# Patient Record
Sex: Female | Born: 1945 | Race: White | Hispanic: No | Marital: Single | Smoking: Never smoker
Health system: Southern US, Community
[De-identification: ages and names within clinical notes are randomized; demographics above are authoritative.]

## PROBLEM LIST (undated history)

## (undated) DIAGNOSIS — R251 Tremor, unspecified: Secondary | ICD-10-CM

## (undated) DIAGNOSIS — D649 Anemia, unspecified: Secondary | ICD-10-CM

## (undated) HISTORY — PX: APPENDECTOMY: SHX54

## (undated) HISTORY — PX: TONSILLECTOMY AND ADENOIDECTOMY: SHX28

## (undated) HISTORY — DX: Tremor, unspecified: R25.1

## (undated) HISTORY — DX: Anemia, unspecified: D64.9

---

## 2000-07-04 ENCOUNTER — Other Ambulatory Visit: Admission: RE | Admit: 2000-07-04 | Discharge: 2000-07-04 | Payer: Self-pay | Admitting: Family Medicine

## 2000-07-10 ENCOUNTER — Encounter: Payer: Self-pay | Admitting: Family Medicine

## 2000-07-10 ENCOUNTER — Encounter: Admission: RE | Admit: 2000-07-10 | Discharge: 2000-07-10 | Payer: Self-pay | Admitting: Family Medicine

## 2001-03-13 ENCOUNTER — Encounter: Admission: RE | Admit: 2001-03-13 | Discharge: 2001-03-13 | Payer: Self-pay | Admitting: Family Medicine

## 2001-03-13 ENCOUNTER — Encounter: Payer: Self-pay | Admitting: Family Medicine

## 2001-09-29 ENCOUNTER — Other Ambulatory Visit: Admission: RE | Admit: 2001-09-29 | Discharge: 2001-09-29 | Payer: Self-pay | Admitting: *Deleted

## 2002-11-02 ENCOUNTER — Other Ambulatory Visit: Admission: RE | Admit: 2002-11-02 | Discharge: 2002-11-02 | Payer: Self-pay | Admitting: *Deleted

## 2003-11-08 ENCOUNTER — Other Ambulatory Visit: Admission: RE | Admit: 2003-11-08 | Discharge: 2003-11-08 | Payer: Self-pay | Admitting: *Deleted

## 2004-11-16 ENCOUNTER — Other Ambulatory Visit: Admission: RE | Admit: 2004-11-16 | Discharge: 2004-11-16 | Payer: Self-pay | Admitting: *Deleted

## 2005-11-19 ENCOUNTER — Other Ambulatory Visit: Admission: RE | Admit: 2005-11-19 | Discharge: 2005-11-19 | Payer: Self-pay | Admitting: Obstetrics and Gynecology

## 2006-11-21 ENCOUNTER — Other Ambulatory Visit: Admission: RE | Admit: 2006-11-21 | Discharge: 2006-11-21 | Payer: Self-pay | Admitting: Obstetrics & Gynecology

## 2007-11-24 ENCOUNTER — Other Ambulatory Visit: Admission: RE | Admit: 2007-11-24 | Discharge: 2007-11-24 | Payer: Self-pay | Admitting: Obstetrics and Gynecology

## 2008-01-06 ENCOUNTER — Emergency Department (HOSPITAL_COMMUNITY): Admission: EM | Admit: 2008-01-06 | Discharge: 2008-01-06 | Payer: Self-pay | Admitting: Emergency Medicine

## 2008-11-24 ENCOUNTER — Other Ambulatory Visit: Admission: RE | Admit: 2008-11-24 | Discharge: 2008-11-24 | Payer: Self-pay | Admitting: Obstetrics and Gynecology

## 2009-09-02 IMAGING — CT CT CERVICAL SPINE W/O CM
4 of 5 series · 15 of 33 positions shown, 17 images · IV contrast (agent unspecified)
Comparison: None.

CLINICAL DATA: Syncopal episode. Struck left side of head and neck.
 HEAD CT WITHOUT CONTRAST:
TECHNIQUE: Contiguous axial images were obtained from the base of the skull through the vertex according to standard protocol without contrast.
TECHNIQUE: Multidetector CT imaging of the cervical spine was performed.  Multiplanar CT image reconstructions were also generated.

[Series 5: c_spine 2.0 b31s · axial · 0.23mm/px · z∈[-270,-188]mm · 3 of 83 slices shown]
[im 21/83  bone]
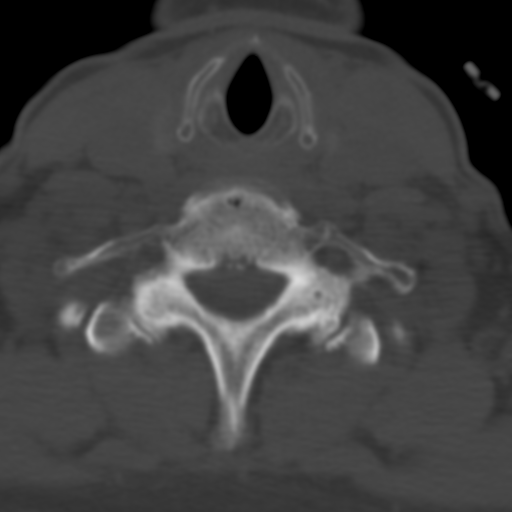
[im 42/83  bone]
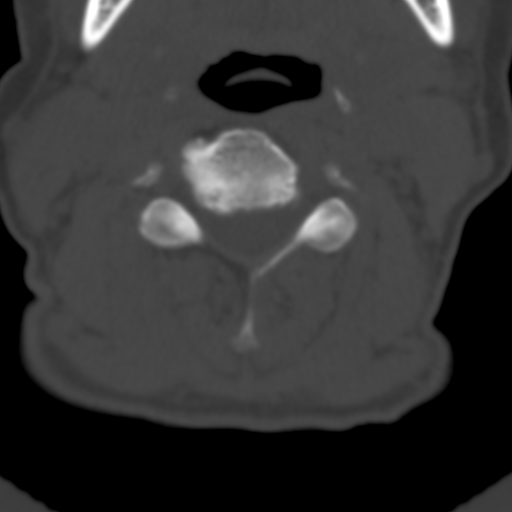
[im 62/83  bone]
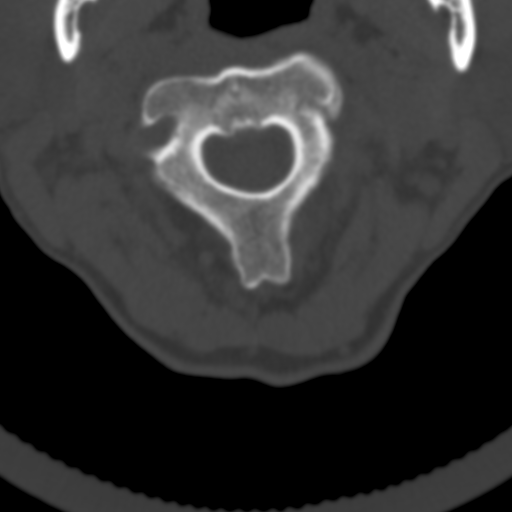

[Series 602: sagittal images · sagittal · 0.32mm/px · 5 of 56 slices shown, 6 images]
[im 19/56  bone]
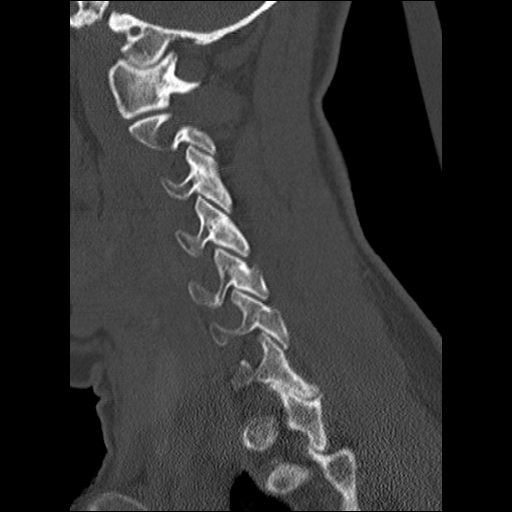
[im 23/56  bone]
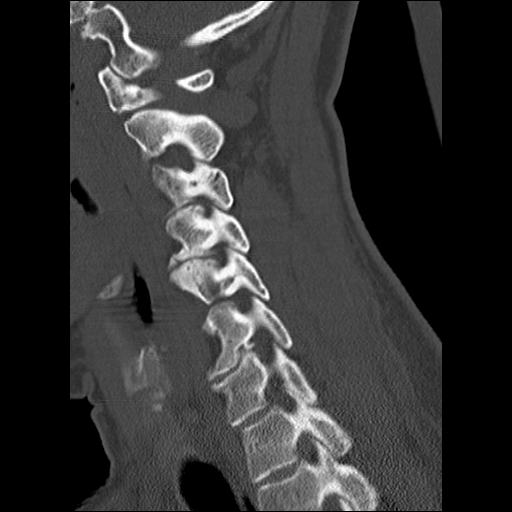
[im 28/56  soft-tissue]
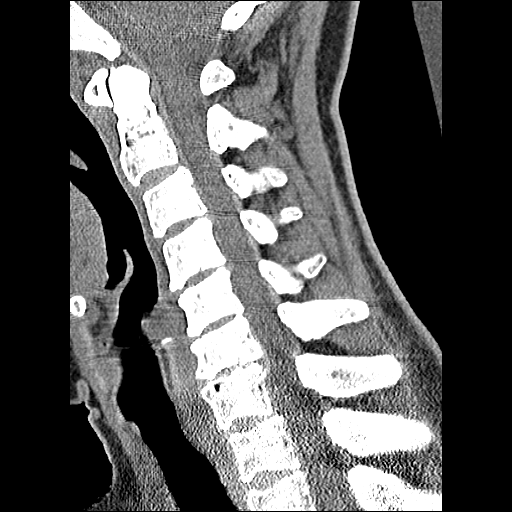
[im 28/56  bone]
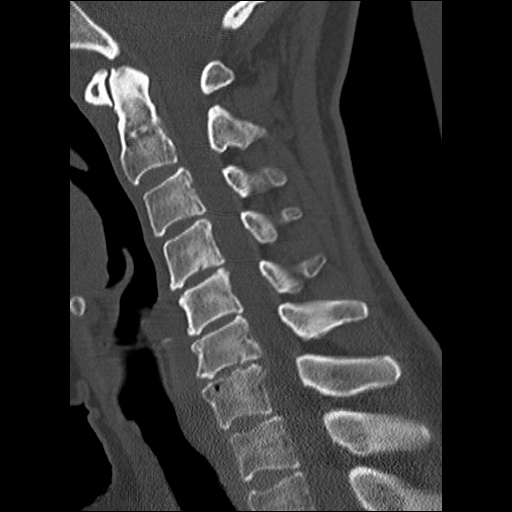
[im 33/56  bone]
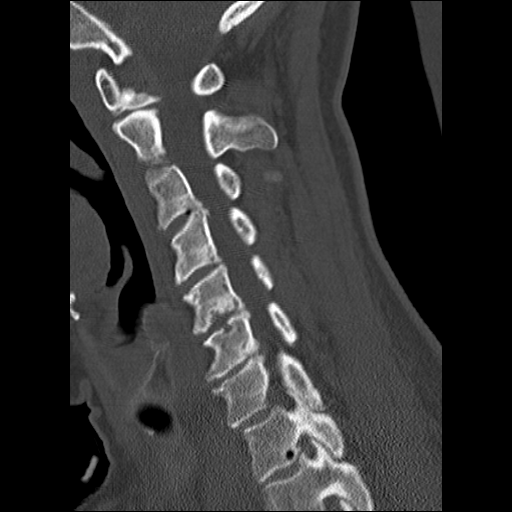
[im 37/56  bone]
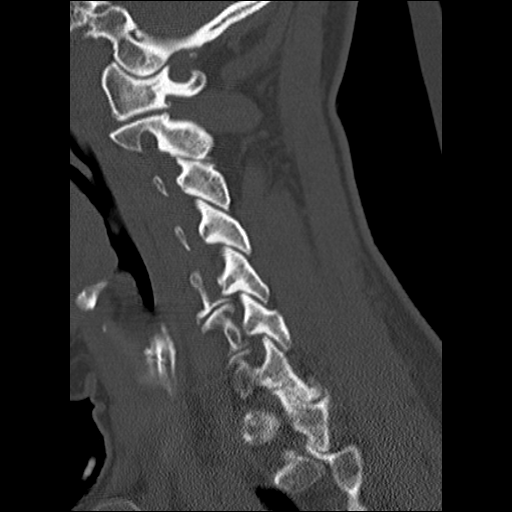

[Series 603: axial reformats · axial · 0.32mm/px · z∈[-317,-215]mm · 4 of 93 slices shown, 5 images]
[im 19/93  soft-tissue]
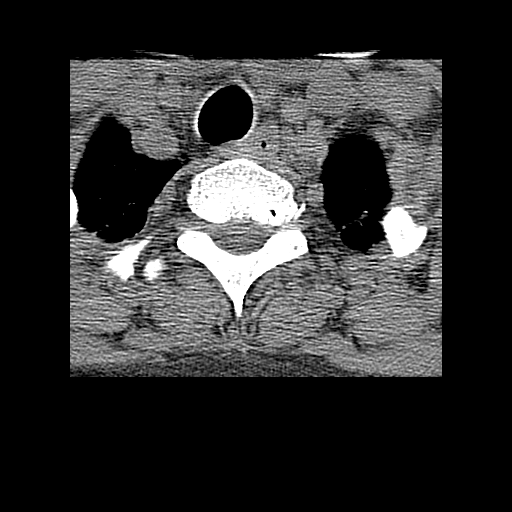
[im 19/93  bone]
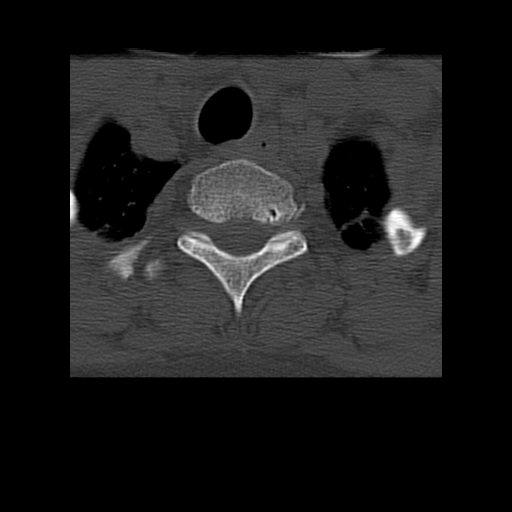
[im 37/93  bone]
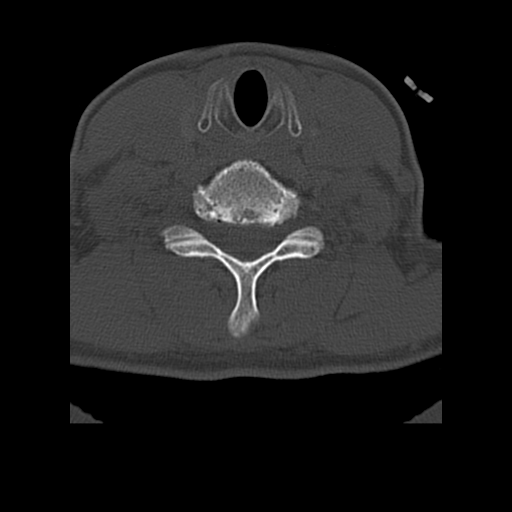
[im 56/93  bone]
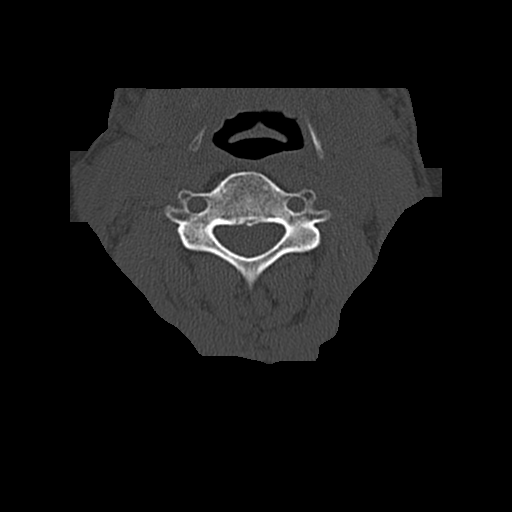
[im 74/93  bone]
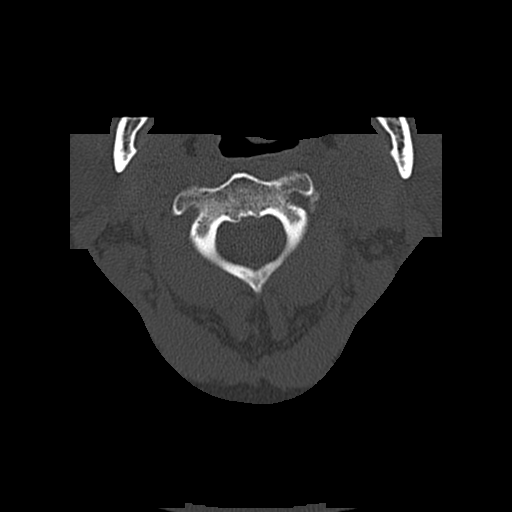

[Series 604: coronal images · coronal · 0.32mm/px · 3 of 47 slices shown]
[im 10/47  bone]
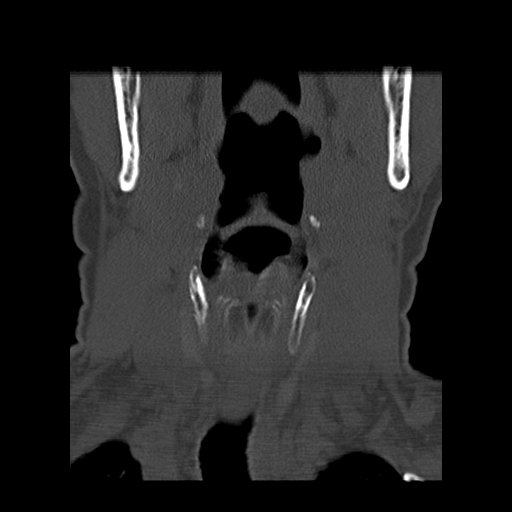
[im 19/47  bone]
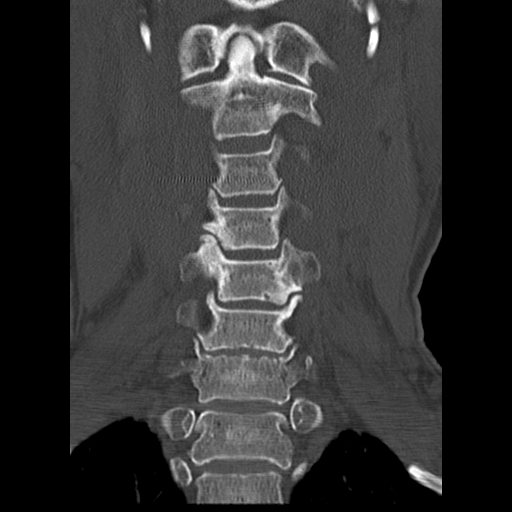
[im 28/47  bone]
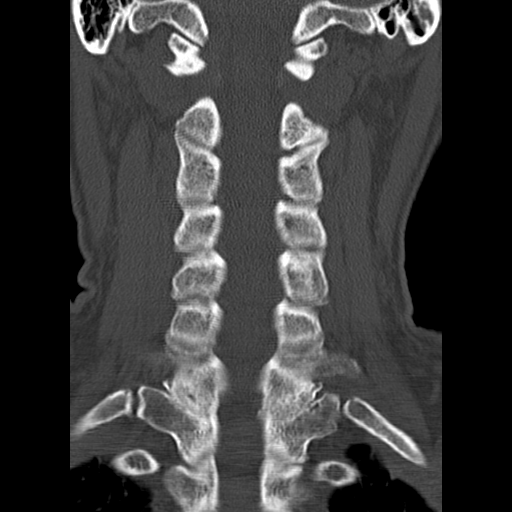

[15 of 33 positions shown; findings below may reference images not displayed]

FINDINGS: No acute or focal intracranial abnormality.  Calvarium intact. There is no fluid in the sinuses.  There are some changes of chronic sinusitis with a nodular or polypoid density along the medial wall of the left maxillary sinus.
IMPRESSION: 1.  No acute intracranial abnormality.
 2.  Mild changes of chronic sinusitis.
 CERVICAL SPINE CT WITHOUT CONTRAST:
FINDINGS: There are degenerative changes especially at C6-7.  There are no fractures or subluxations. At C5-6, there is relative narrowing of the AP dimension of the neural canal, and there is also mild biforaminal stenosis. There is also relative narrowing of the AP diameter of the neural canal at C6-7.  There are no obvious fractures.  This is probably mild congenital and acquired spinal stenosis.  This could make the patient predisposed to cord injury in the setting of trauma.
 Prevertebral soft tissues are normal.
IMPRESSION: No acute fractures or subluxations.  There is relative spinal stenosis at C5-6 and C6-7 which could make the patient predisposed to spinal cord injury. This needs careful clinical correlation.

## 2011-06-15 ENCOUNTER — Other Ambulatory Visit: Payer: Self-pay | Admitting: Obstetrics and Gynecology

## 2011-06-15 DIAGNOSIS — Z1231 Encounter for screening mammogram for malignant neoplasm of breast: Secondary | ICD-10-CM

## 2011-07-02 ENCOUNTER — Ambulatory Visit (HOSPITAL_COMMUNITY)
Admission: RE | Admit: 2011-07-02 | Discharge: 2011-07-02 | Disposition: A | Discharge status: Home / Self Care | Payer: BC Managed Care – PPO | Source: Ambulatory Visit | Attending: Obstetrics and Gynecology | Admitting: Obstetrics and Gynecology

## 2011-07-02 DIAGNOSIS — Z1231 Encounter for screening mammogram for malignant neoplasm of breast: Secondary | ICD-10-CM

## 2011-08-03 LAB — BASIC METABOLIC PANEL
GFR calc non Af Amer: >60
Potassium: 3.9
Sodium: 138

## 2011-08-03 LAB — DIFFERENTIAL
Basophils Absolute: 0
Basophils Relative: 0
Eosinophils Absolute: 0.1
Lymphocytes Relative: 13
Lymphs Abs: 0.9
Monocytes Relative: 8

## 2011-08-03 LAB — CBC
HCT: 36.5
Hemoglobin: 12.5
RBC: 4.04
WBC: 7.2

## 2012-01-08 ENCOUNTER — Other Ambulatory Visit: Payer: Self-pay | Admitting: Plastic Surgery

## 2013-08-12 ENCOUNTER — Telehealth: Payer: Self-pay | Admitting: Nurse Practitioner

## 2013-08-12 NOTE — Telephone Encounter (Signed)
Last mammogram done 07/02/12 at [facility].  Left message to inform.

## 2013-08-12 NOTE — Telephone Encounter (Signed)
Pt would like to find out where she got her last mammogram done.

## 2013-09-03 ENCOUNTER — Telehealth: Payer: Self-pay | Admitting: Nurse Practitioner

## 2013-09-03 DIAGNOSIS — Z1321 Encounter for screening for nutritional disorder: Secondary | ICD-10-CM

## 2013-09-03 NOTE — Telephone Encounter (Signed)
Call to patient, poor connection and line disconnects.

## 2013-09-03 NOTE — Telephone Encounter (Signed)
Chief Complaint  Patient presents with   Medication Refill  pt would like a prescription for vitamin d. Would like to know where she can fill that wont be so expensive.

## 2013-09-04 MED ORDER — VITAMIN D3 1.25 MG (50000 UT) PO CAPS: 1.0000 | ORAL_CAPSULE | ORAL | Status: DC

## 2013-09-04 NOTE — Telephone Encounter (Signed)
Patient calling back. She would like a paper rx for this rx if you decide to fill for the year and pick up today.

## 2013-09-04 NOTE — Telephone Encounter (Signed)
Kayla Wang,  Patient calling for refill vitamin D 50,000 units 1 tablet po q week.  Last AEX 12/2012.  Last Vitamin D Level in chart I could find was done 12/30/11 and was 26.  Does she need labs first before refill?

## 2013-09-04 NOTE — Telephone Encounter (Signed)
Can she get Vit D drawn today when she comes in to ger [facility]?

## 2013-09-04 NOTE — Telephone Encounter (Signed)
Message left to return call to Stuart at [phone]. Rx will be at front desk. Lab ordered. Rx to [facility] for signature.

## 2013-12-24 ENCOUNTER — Ambulatory Visit (INDEPENDENT_AMBULATORY_CARE_PROVIDER_SITE_OTHER): Payer: Medicare Other | Admitting: Nurse Practitioner

## 2013-12-24 ENCOUNTER — Encounter: Payer: Self-pay | Admitting: *Deleted

## 2013-12-24 VITALS — BP 120/66 | HR 72 | Ht 65.5 in | Wt 160.0 lb

## 2013-12-24 DIAGNOSIS — Z01419 Encounter for gynecological examination (general) (routine) without abnormal findings: Secondary | ICD-10-CM

## 2013-12-24 DIAGNOSIS — Z0000 Encounter for general adult medical examination without abnormal findings: Secondary | ICD-10-CM

## 2013-12-24 DIAGNOSIS — E559 Vitamin D deficiency, unspecified: Secondary | ICD-10-CM

## 2013-12-24 LAB — POCT URINALYSIS DIPSTICK
BILIRUBIN UA: NEGATIVE
Glucose, UA: NEGATIVE
KETONES UA: NEGATIVE
LEUKOCYTES UA: NEGATIVE
Nitrite, UA: NEGATIVE
PH UA: 5
Protein, UA: NEGATIVE
RBC UA: NEGATIVE
Urobilinogen, UA: NEGATIVE

## 2013-12-24 LAB — HEMOGLOBIN, FINGERSTICK: Hemoglobin, fingerstick: 13 g/dL (ref 12.0–16.0)

## 2013-12-24 NOTE — Progress Notes (Signed)
Patient ID: Kayla Wang, female   DOB: 1946-09-17, 68 y.o.   MRN: [id number] 68 y.o. G0P0 Single Caucasian Fe here for annual exam. No new health problems.  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  No LMP recorded. Patient is postmenopausal.          Sexually active: no  The current method of family planning is none.    Exercising: yes  swimming and walking 4-5 times per week Smoker:  no  Health Maintenance: Pap:  12/20/11, WNL, neg HR HPV MMG:  08/13/13, need add'l images; 08/18/13, Bi-Rads 1: negative Colonoscopy:  02/19/05, repeat in 10 years BMD:  11/2009, 0.8/0.0/1.1 TDaP: 01/15/07 at PCP Labs:  HB:  13.0 Urine:  negative   reports that she has never smoked. She has never used smokeless tobacco. She reports that she does not drink alcohol or use illicit drugs.  Past Medical History  Diagnosis Date  . Anemia     Past Surgical History  Procedure Laterality Date  . Tonsillectomy and adenoidectomy  age 25  . Appendectomy  age 80    Current Outpatient Prescriptions  Medication Sig Dispense Refill  . Cholecalciferol (VITAMIN D3) 50000 UNITS CAPS Take 1 capsule by mouth once a week.  12 capsule  3   No current facility-administered medications for this visit.    Family History  Problem Relation Age of Onset  . Osteoporosis Mother   . Celiac disease Sister   . Cancer Maternal Grandfather     unknown, heavy smoker    ROS:  Pertinent items are noted in HPI.  Otherwise, a comprehensive ROS was negative.  Exam:   BP 120/66  Pulse 72  Ht 5' 5.5" (1.664 m)  Wt 160 lb (72.576 kg)  BMI 26.21 kg/m2 Height: 5' 5.5" (166.4 cm)  Ht Readings from Last 3 Encounters:  12/24/13 5' 5.5" (1.664 m)    General appearance: alert, cooperative and appears stated age Head: Normocephalic, without obvious abnormality, atraumatic Neck: no adenopathy, supple, symmetrical, trachea midline and thyroid normal to inspection and palpation Lungs: clear to  auscultation bilaterally Breasts: normal appearance, no masses or tenderness Heart: regular rate and rhythm Abdomen: soft, non-tender; no masses,  no organomegaly Extremities: extremities normal, atraumatic, no cyanosis or edema Skin: Skin color, texture, turgor normal. No rashes or lesions Lymph nodes: Cervical, supraclavicular, and axillary nodes normal. No abnormal inguinal nodes palpated Neurologic: Grossly normal   Pelvic: External genitalia:  no lesions              Urethra:  normal appearing urethra with no masses, tenderness or lesions              Bartholin's and Skene's: normal                 Vagina: normal appearing vagina with normal color and discharge, no lesions              Cervix: anteverted              Pap taken: no Bimanual Exam:  Uterus:  normal size, contour, position, consistency, mobility, non-tender              Adnexa: no mass, fullness, tenderness               Rectovaginal: Confirms               Anus:  normal sphincter tone, no lesions  A:  Well Woman with normal exam  Postmenopausal - no HRT  FMH heart disease  Vit D deficiency  P:   Pap smear as per guidelines   Mammogram due 10/15  Refill Vit D  Will return for fasting  Counseled on breast self exam, mammography screening, adequate intake of calcium and vitamin D, diet and exercise return annually or prn  An After Visit Summary was printed and given to the patient.

## 2013-12-24 NOTE — Patient Instructions (Signed)

## 2013-12-25 ENCOUNTER — Telehealth: Payer: Self-pay | Admitting: Nurse Practitioner

## 2013-12-25 LAB — VITAMIN D 25 HYDROXY (VIT D DEFICIENCY, FRACTURES): Vit D, 25-Hydroxy: 26 ng/mL — ABNORMAL LOW (ref 30–89)

## 2013-12-25 NOTE — Telephone Encounter (Signed)
Had AEX with Kayla Wang on 12-24-13.  Patient canceled lab appt, just FYI. If no follow-up, close encounter.

## 2013-12-25 NOTE — Telephone Encounter (Signed)
Patient called and canceled her lab appt for next week said she just wants to have her primary doctor do all of that. Said not to run anything through.

## 2013-12-28 ENCOUNTER — Other Ambulatory Visit (INDEPENDENT_AMBULATORY_CARE_PROVIDER_SITE_OTHER): Payer: Medicare Other

## 2013-12-28 DIAGNOSIS — Z1321 Encounter for screening for nutritional disorder: Secondary | ICD-10-CM

## 2013-12-28 DIAGNOSIS — Z0000 Encounter for general adult medical examination without abnormal findings: Secondary | ICD-10-CM

## 2013-12-28 LAB — COMPREHENSIVE METABOLIC PANEL
ALBUMIN: 4.3 g/dL (ref 3.5–5.2)
ALK PHOS: 46 U/L (ref 39–117)
ALT: 10 U/L (ref 0–35)
AST: 13 U/L (ref 0–37)
BUN: 15 mg/dL (ref 6–23)
CALCIUM: 9 mg/dL (ref 8.4–10.5)
CO2: 27 mEq/L (ref 19–32)
Chloride: 106 mEq/L (ref 96–112)
Creat: 0.79 mg/dL (ref 0.50–1.10)
GLUCOSE: 96 mg/dL (ref 70–99)
POTASSIUM: 4.4 meq/L (ref 3.5–5.3)
Sodium: 140 mEq/L (ref 135–145)
Total Bilirubin: 0.4 mg/dL (ref 0.2–1.2)
Total Protein: 6.3 g/dL (ref 6.0–8.3)

## 2013-12-28 LAB — LIPID PANEL
CHOLESTEROL: 169 mg/dL (ref 0–200)
HDL: 57 mg/dL (ref >39)
LDL Cholesterol: 92 mg/dL (ref 0–99)
TRIGLYCERIDES: 99 mg/dL (ref <150)
Total CHOL/HDL Ratio: 3 Ratio
VLDL: 20 mg/dL (ref 0–40)

## 2013-12-28 LAB — TSH: TSH: 2.721 u[IU]/mL (ref 0.350–4.500)

## 2013-12-29 NOTE — Progress Notes (Signed)
Encounter reviewed by Dr. Conley SimmondsBrook Silva. Order was placed for fasting labs.

## 2013-12-30 ENCOUNTER — Other Ambulatory Visit: Payer: Medicare Other

## 2014-12-27 ENCOUNTER — Ambulatory Visit: Payer: Medicare Other | Admitting: Nurse Practitioner

## 2015-03-03 ENCOUNTER — Encounter: Payer: Self-pay | Admitting: Nurse Practitioner

## 2015-03-03 ENCOUNTER — Ambulatory Visit (INDEPENDENT_AMBULATORY_CARE_PROVIDER_SITE_OTHER): Payer: Medicare Other | Admitting: Nurse Practitioner

## 2015-03-03 VITALS — BP 90/56 | HR 60 | Ht 65.5 in | Wt 156.0 lb

## 2015-03-03 DIAGNOSIS — Z0000 Encounter for general adult medical examination without abnormal findings: Secondary | ICD-10-CM

## 2015-03-03 DIAGNOSIS — E559 Vitamin D deficiency, unspecified: Secondary | ICD-10-CM | POA: Diagnosis not present

## 2015-03-03 DIAGNOSIS — R319 Hematuria, unspecified: Secondary | ICD-10-CM | POA: Diagnosis not present

## 2015-03-03 DIAGNOSIS — Z01419 Encounter for gynecological examination (general) (routine) without abnormal findings: Secondary | ICD-10-CM | POA: Diagnosis not present

## 2015-03-03 LAB — POCT URINALYSIS DIPSTICK
BILIRUBIN UA: NEGATIVE
Glucose, UA: NEGATIVE
Ketones, UA: NEGATIVE
Leukocytes, UA: NEGATIVE
NITRITE UA: NEGATIVE
PH UA: 5.5
Urobilinogen, UA: NEGATIVE

## 2015-03-03 MED ORDER — VITAMIN D3 1.25 MG (50000 UT) PO CAPS: 1.0000 | ORAL_CAPSULE | ORAL | Status: DC

## 2015-03-03 NOTE — Progress Notes (Signed)
Patient ID: Kayla Wang, female   DOB: 06-07-1946, 69 y.o.   MRN: [id number] 69 y.o. G0P0 Single  Caucasian Fe here for annual exam.  No new health problems.  Patient's last menstrual period was 07/13/2000.          Sexually active: No.  The current method of family planning is none and abstinence.    Exercising: Yes.    swimming and walking Smoker:  no  Health Maintenance: Pap:  12/20/11, negative with neg HR HPV MMG:  08/19/14, Bi-Rads 1:  Negative Colonoscopy:  02/19/05, repeat in 10 years will schedule with Dr. Loreta AveMann BMD:   11/2009, 0.8/0.0/1.1 TDaP:  01/15/07 Labs:  HB:  13.4  Urine:  Trace RBC, trace protein   reports that she has never smoked. She has never used smokeless tobacco. She reports that she does not drink alcohol or use illicit drugs.  Past Medical History  Diagnosis Date  . Anemia     Past Surgical History  Procedure Laterality Date  . Tonsillectomy and adenoidectomy  age 69  . Appendectomy  age 69    Current Outpatient Prescriptions  Medication Sig Dispense Refill  . Cholecalciferol (VITAMIN D3) 50000 UNITS CAPS Take 1 capsule by mouth once a week. 30 capsule 3   No current facility-administered medications for this visit.    Family History  Problem Relation Age of Onset  . Osteoporosis Mother   . Celiac disease Sister   . Cancer Maternal Grandfather     unknown, heavy smoker    ROS:  Pertinent items are noted in HPI.  Otherwise, a comprehensive ROS was negative.  Exam:   BP 90/56 mmHg  Pulse 60  Ht 5' 5.5" (1.664 m)  Wt 156 lb (70.761 kg)  BMI 25.56 kg/m2  LMP 07/13/2000 Height: 5' 5.5" (166.4 cm) Ht Readings from Last 3 Encounters:  03/03/15 5' 5.5" (1.664 m)  12/24/13 5' 5.5" (1.664 m)    General appearance: alert, cooperative and appears stated age Head: Normocephalic, without obvious abnormality, atraumatic Neck: no adenopathy, supple, symmetrical, trachea midline and thyroid normal to inspection and palpation Lungs: clear to  auscultation bilaterally Breasts: normal appearance, no masses or tenderness Heart: regular rate and rhythm Abdomen: soft, non-tender; no masses,  no organomegaly Extremities: extremities normal, atraumatic, no cyanosis or edema Skin: Skin color, texture, turgor normal. No rashes or lesions Lymph nodes: Cervical, supraclavicular, and axillary nodes normal. No abnormal inguinal nodes palpated Neurologic: Grossly normal   Pelvic: External genitalia:  no lesions              Urethra:  normal appearing urethra with no masses, tenderness or lesions              Bartholin's and Skene's: normal                 Vagina: normal appearing vagina with normal color and discharge, no lesions              Cervix: anteverted              Pap taken: Yes.   Bimanual Exam:  Uterus:  normal size, contour, position, consistency, mobility, non-tender              Adnexa: no mass, fullness, tenderness               Rectovaginal: Confirms               Anus:  normal sphincter tone, no lesions  Chaperone present:  yes  A:  Well Woman with normal exam  Postmenopausal - no HRT FMH heart disease Vit D deficiency   P:   Reviewed health and wellness pertinent to exam  Pap smear taken today  Mammogram is due 08/2015  Note faxed for BMD  Refill on Vit D and will follow with labs  Counseled on breast self exam, mammography screening, adequate intake of calcium and vitamin D, diet and exercise return annually or prn  An After Visit Summary was printed and given to the patient.

## 2015-03-03 NOTE — Patient Instructions (Signed)

## 2015-03-04 LAB — URINALYSIS, MICROSCOPIC ONLY
CASTS: NONE SEEN
CRYSTALS: NONE SEEN
Squamous Epithelial / LPF: NONE SEEN

## 2015-03-04 LAB — IPS PAP SMEAR ONLY

## 2015-03-04 LAB — VITAMIN D 25 HYDROXY (VIT D DEFICIENCY, FRACTURES): VIT D 25 HYDROXY: 44 ng/mL (ref 30–100)

## 2015-03-06 NOTE — Progress Notes (Signed)
Encounter reviewed by Dr. Brook Silva.  

## 2015-03-07 LAB — HEMOGLOBIN, FINGERSTICK: HEMOGLOBIN, FINGERSTICK: 13.4 g/dL (ref 12.0–16.0)

## 2015-03-10 ENCOUNTER — Telehealth: Payer: Self-pay | Admitting: Nurse Practitioner

## 2015-03-10 NOTE — Telephone Encounter (Signed)
Spoke with patient. Advised of message as seen below from Lauro FranklinPatricia Rolen-Grubb, FNP. Patient is agreeable and verbalizes understanding.  Notes Recorded by Luisa DagoStephanie C Phillips, CMA on 03/08/2015 at 2:12 PM Pt notified of results via [phone] (Home) *Preferred* per DPR. Pt advised in message to call with any questions. Notes Recorded by Roanna BanningPatricia R Grubb, FNP on 03/04/2015 at 6:07 PM pap02 Notes Recorded by Roanna BanningPatricia R Grubb, FNP on 03/04/2015 at 8:19 AM Let pt. Know that Vit D is 44 - last year at 3826. Much better. Continue on weekly dose and will recheck at next AEX. Urine with no RBC.  Routing to provider for final review. Patient agreeable to disposition. Will close encounter

## 2015-03-10 NOTE — Telephone Encounter (Signed)
Patient states someone called her yesterday with results and she is returning a call. I do not see a message.

## 2015-03-17 ENCOUNTER — Telehealth: Payer: Self-pay | Admitting: Nurse Practitioner

## 2015-03-17 NOTE — Telephone Encounter (Signed)
Please let pt. Know that BMD done on 03/07/15 shows a T Score at the spine of 0.5; left hip neck -0.2; right hip neck 0.0.  These all fall in the normal BMD range.    The FRAX score for major fracture in 10 years is 7.1% (goal is <20%);  FRAX score for major hip fracture in 10 years is 0.3% (goal is <3% ).   Comparison from the study 12/10/11 shows there has been a bone loss at the spine of 2.5 %.  While some bone loss is normal and expectant, we do not want to see further loss at the spine.  Must do exercises with upper body weight to maintain the stability.  Also continue with calcium and Vit D.

## 2015-03-17 NOTE — Telephone Encounter (Signed)
I have attempted to contact this patient by phone with the following results: left message to return my call on answering machine (home per DPR).  [phone])

## 2015-03-21 NOTE — Telephone Encounter (Signed)
Pt notified of results.  She is agreeable and voices understanding.  She will call with any further questions.

## 2015-03-28 ENCOUNTER — Ambulatory Visit: Payer: Self-pay | Admitting: Nurse Practitioner

## 2015-07-11 ENCOUNTER — Encounter: Payer: Self-pay | Admitting: Nurse Practitioner

## 2015-07-11 ENCOUNTER — Ambulatory Visit (INDEPENDENT_AMBULATORY_CARE_PROVIDER_SITE_OTHER): Payer: Medicare Other | Admitting: Nurse Practitioner

## 2015-07-11 VITALS — BP 100/60 | HR 60 | Resp 14 | Wt 146.8 lb

## 2015-07-11 DIAGNOSIS — R319 Hematuria, unspecified: Secondary | ICD-10-CM

## 2015-07-11 DIAGNOSIS — N390 Urinary tract infection, site not specified: Secondary | ICD-10-CM

## 2015-07-11 LAB — POCT URINALYSIS DIPSTICK
Bilirubin, UA: NEGATIVE
Blood, UA: 50
GLUCOSE UA: NEGATIVE
KETONES UA: NEGATIVE
Nitrite, UA: NEGATIVE
PROTEIN UA: NEGATIVE
Urobilinogen, UA: NEGATIVE
pH, UA: 5

## 2015-07-11 MED ORDER — NITROFURANTOIN MONOHYD MACRO 100 MG PO CAPS: 100.0000 mg | ORAL_CAPSULE | Freq: Two times a day (BID) | ORAL | Status: DC

## 2015-07-11 NOTE — Patient Instructions (Signed)
Urinary Tract Infection Urinary tract infections (UTIs) can develop anywhere along your urinary tract. Your urinary tract is your body's drainage system for removing wastes and extra water. Your urinary tract includes two kidneys, two ureters, a bladder, and a urethra. Your kidneys are a pair of bean-shaped organs. Each kidney is about the size of your fist. They are located below your ribs, one on each side of your spine. CAUSES Infections are caused by microbes, which are microscopic organisms, including fungi, viruses, and bacteria. These organisms are so small that they can only be seen through a microscope. Bacteria are the microbes that most commonly cause UTIs. SYMPTOMS  Symptoms of UTIs may vary by age and gender of the patient and by the location of the infection. Symptoms in young women typically include a frequent and intense urge to urinate and a painful, burning feeling in the bladder or urethra during urination. Older women and men are more likely to be tired, shaky, and weak and have muscle aches and abdominal pain. A fever may mean the infection is in your kidneys. Other symptoms of a kidney infection include pain in your back or sides below the ribs, nausea, and vomiting. DIAGNOSIS To diagnose a UTI, your caregiver will ask you about your symptoms. Your caregiver also will ask to provide a urine sample. The urine sample will be tested for bacteria and white blood cells. White blood cells are made by your body to help fight infection. TREATMENT  Typically, UTIs can be treated with medication. Because most UTIs are caused by a bacterial infection, they usually can be treated with the use of antibiotics. The choice of antibiotic and length of treatment depend on your symptoms and the type of bacteria causing your infection. HOME CARE INSTRUCTIONS  If you were prescribed antibiotics, take them exactly as your caregiver instructs you. Finish the medication even if you feel better after you  have only taken some of the medication.  Drink enough water and fluids to keep your urine clear or pale yellow.  Avoid caffeine, tea, and carbonated beverages. They tend to irritate your bladder.  Empty your bladder often. Avoid holding urine for long periods of time.  Empty your bladder before and after sexual intercourse.  After a bowel movement, women should cleanse from front to back. Use each tissue only once. SEEK MEDICAL CARE IF:   You have back pain.  You develop a fever.  Your symptoms do not begin to resolve within 3 days. SEEK IMMEDIATE MEDICAL CARE IF:   You have severe back pain or lower abdominal pain.  You develop chills.  You have nausea or vomiting.  You have continued burning or discomfort with urination. MAKE SURE YOU:   Understand these instructions.  Will watch your condition.  Will get help right away if you are not doing well or get worse. Document Released: 08/08/2005 Document Revised: 04/29/2012 Document Reviewed: 12/07/2011 [facility] Patient Information 2015 [place], [place]. This information is not intended to replace advice given to you by your health care provider. Make sure you discuss any questions you have with your health care provider.   AZO for urinary symptoms

## 2015-07-11 NOTE — Progress Notes (Signed)
S:  69 y.o.SW female G0 presents with complaint of UTI. Symptoms began on Saturday evening. With symptoms of dysuria, urinary frequency, urinary urgency. Pertinent negatives include The patient is having no constitutional symptoms, denying fever, chills, anorexia, or weight loss.  Sexually active no  Symptoms not related to post coital. Menopausal and no complaints of vaginal dryness. Last UTI documented over 20 years ago  ROS: no weight loss, fever, night sweats and feels well  O alert, oriented to person, place, and time, normal mood, behavior, speech, dress, motor activity, and thought processes   thin, healthy,  alert and  not in acute distress  Abdomen:  Mild tenderness over the suprapubic  No CVA tenderness  deferred   Diagnostic Test:    Urinalysis:  Large WBC, trace of RBC   PROCEDURES:  Urine for C&S and micro   Assessment: R/O UTI  Plan:  Maintain adequate hydration. Follow up if symptoms not improving, and as needed.   Medication Therapy: Macrobid 100 mg BID printed   Lab: will follow with Urine Culture     RV

## 2015-07-12 LAB — URINALYSIS, MICROSCOPIC ONLY
Bacteria, UA: NONE SEEN [HPF]
CASTS: NONE SEEN [LPF]
Crystals: NONE SEEN [HPF]
Yeast: NONE SEEN [HPF]

## 2015-07-13 ENCOUNTER — Telehealth: Payer: Self-pay | Admitting: Nurse Practitioner

## 2015-07-13 NOTE — Progress Notes (Signed)
Encounter reviewed by Dr. Brook Amundson C. Silva.  

## 2015-07-13 NOTE — Telephone Encounter (Signed)
Pt was left a message that urine culture not back yet and stay on med's until we get final results.

## 2015-07-14 LAB — URINE CULTURE: Colony Count: 75000

## 2015-07-15 NOTE — Telephone Encounter (Signed)
-----   Message from Ria Comment, FNP sent at 07/15/2015  9:41 AM EDT ----- Please let pt. Know that her urine culture was positive and she is on the correct antibiotic.  Complete all of med's and increase fluids.  When med's are completed if any symptoms continue to let me know.

## 2015-07-15 NOTE — Telephone Encounter (Signed)
Return call to Stephanie. °

## 2015-07-15 NOTE — Telephone Encounter (Signed)
I have attempted to contact this patient by phone with the following results: left message to return call to [place] at [phone] answering machine (home per [facility]).  No personal information given.  [phone] (Home) *Preferred*

## 2015-07-19 NOTE — Telephone Encounter (Signed)
I have attempted to contact this patient by phone with the following results: left message to return call to [place] at [phone] answering machine (home per [facility]). No personal information given. [phone] (Home) *Preferred* Advised pt to ask for triage nurse if I am unavailable.

## 2015-07-21 NOTE — Telephone Encounter (Signed)
Pt notified in result note.  Closing encounter. 

## 2015-09-19 ENCOUNTER — Telehealth: Payer: Self-pay | Admitting: Nurse Practitioner

## 2015-09-19 NOTE — Telephone Encounter (Signed)
Patient called wants to know how often she should get a bone density done. Best # to reach patient: [phone]

## 2015-09-19 NOTE — Telephone Encounter (Signed)
Left message to call Kayla Wang at [phone]. 

## 2015-09-19 NOTE — Telephone Encounter (Signed)
Please refer to BMD report as noted.  She falls in normal range.  She could have a repeat in 2-3 years.   Please let pt. Know that BMD done on 03/07/15 shows a T Score at the spine of 0.5; left hip neck -0.2; right hip neck 0.0. These all fall in the normal BMD range. The FRAX score for major fracture in 10 years is 7.1% (goal is <20%); FRAX score for major hip fracture in 10 years is 0.3% (goal is <3% ). Comparison from the study 12/10/11 shows there has been a bone loss at the spine of 2.5 %. While some bone loss is normal and expectant, we do not want to see further loss at the spine. Must do exercises with upper body weight to maintain the stability. Also continue with calcium and Vit D.

## 2015-09-22 MED ORDER — VITAMIN D3 1.25 MG (50000 UT) PO CAPS: 1.0000 | ORAL_CAPSULE | ORAL | Status: DC

## 2015-09-22 NOTE — Telephone Encounter (Signed)
Signed rx for Vitamin D 50,000 IU weekly mailed to patient's verified home address. Patient notified and is agreeable.  Routing to provider for final review. Patient agreeable to disposition. Will close encounter.

## 2015-09-22 NOTE — Telephone Encounter (Signed)
signed

## 2015-09-22 NOTE — Telephone Encounter (Signed)
Spoke with patient. Advised of message and results as seen below from Ria CommentPatricia Grubb, FNP. Patient is agreeable and verbalizes understanding. Patient states that she is on her last refill of her Vitamin D 50,000 IU which she is taking weekly. Requesting a paper rx be mailed to her home address on file so that she can check the price of the prescription with different pharmacies. Vitamin D was last drawn on 03/03/2015 and was 44. Was Advised to continue taking Vitamin D 50,000 IU weekly for one year (please see result note).   Routing to PepsiCoDeborah Leonard CNM as Ria CommentPatricia Grubb, FNP is out of the office today. Rx to Leota Sauerseborah Leonard CNM for signature.

## 2016-03-05 ENCOUNTER — Telehealth: Payer: Self-pay | Admitting: Nurse Practitioner

## 2016-03-05 ENCOUNTER — Encounter: Payer: Self-pay | Admitting: Nurse Practitioner

## 2016-03-05 ENCOUNTER — Ambulatory Visit (INDEPENDENT_AMBULATORY_CARE_PROVIDER_SITE_OTHER): Payer: Medicare Other | Admitting: Nurse Practitioner

## 2016-03-05 VITALS — BP 110/60 | HR 74 | Resp 16 | Ht 65.25 in | Wt 146.0 lb

## 2016-03-05 DIAGNOSIS — E559 Vitamin D deficiency, unspecified: Secondary | ICD-10-CM | POA: Diagnosis not present

## 2016-03-05 DIAGNOSIS — Z0000 Encounter for general adult medical examination without abnormal findings: Secondary | ICD-10-CM | POA: Diagnosis not present

## 2016-03-05 DIAGNOSIS — Z01419 Encounter for gynecological examination (general) (routine) without abnormal findings: Secondary | ICD-10-CM

## 2016-03-05 DIAGNOSIS — Z205 Contact with and (suspected) exposure to viral hepatitis: Secondary | ICD-10-CM

## 2016-03-05 LAB — POCT URINALYSIS DIPSTICK
BILIRUBIN UA: NEGATIVE
GLUCOSE UA: NEGATIVE
Ketones, UA: NEGATIVE
LEUKOCYTES UA: NEGATIVE
NITRITE UA: NEGATIVE
Protein, UA: NEGATIVE
Urobilinogen, UA: NEGATIVE
pH, UA: 5

## 2016-03-05 MED ORDER — VITAMIN D3 1.25 MG (50000 UT) PO CAPS: 1.0000 | ORAL_CAPSULE | ORAL | Status: DC

## 2016-03-05 NOTE — Progress Notes (Signed)
70 y.o. G0P0 Single  Caucasian Fe here for annual exam.  No new diagnosis.  Feels that her life is going well and enjoys her time.  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  She is now working in the area of [employer].   Patient's last menstrual period was 07/13/2000.          Sexually active: No.  The current method of family planning is post menopausal status.    Exercising: Yes.    Swim, walking  3-5 times a week Smoker:  no  Health Maintenance: Pap:  03/03/15 Neg MMG:  10/12/15 BIRADS1:neg Colonoscopy:  03/21/15 Normal - repeat 10 years  BMD:   03/07/15 Normal  TDaP:  01/15/2007  Shingles: Unsure Pneumonia: Unsure  Hep C : done today Labs: Fasting labs here today   reports that she has never smoked. She has never used smokeless tobacco. She reports that she does not drink alcohol or use illicit drugs.  Past Medical History  Diagnosis Date  . Anemia     Past Surgical History  Procedure Laterality Date  . Tonsillectomy and adenoidectomy  age 70  . Appendectomy  age 70    Current Outpatient Prescriptions  Medication Sig Dispense Refill  . Cholecalciferol (VITAMIN D3) 50000 units CAPS Take 1 capsule by mouth once a week. 30 capsule 3   No current facility-administered medications for this visit.    Family History  Problem Relation Age of Onset  . Osteoporosis Mother   . Celiac disease Sister   . Cancer Maternal Grandfather     unknown, heavy smoker    ROS:  Pertinent items are noted in HPI.  Otherwise, a comprehensive ROS was negative.  Exam:   BP 110/60 mmHg  Pulse 74  Resp 16  Ht 5' 5.25" (1.657 m)  Wt 146 lb (66.225 kg)  BMI 24.12 kg/m2  LMP 07/13/2000 Height: 5' 5.25" (165.7 cm) Ht Readings from Last 3 Encounters:  03/05/16 5' 5.25" (1.657 m)  03/03/15 5' 5.5" (1.664 m)  12/24/13 5' 5.5" (1.664 m)    General appearance: alert, cooperative and appears stated  age Head: Normocephalic, without obvious abnormality, atraumatic Neck: no adenopathy, supple, symmetrical, trachea midline and thyroid normal to inspection and palpation Lungs: clear to auscultation bilaterally Breasts: normal appearance, no masses or tenderness Heart: regular rate and rhythm Abdomen: soft, non-tender; no masses,  no organomegaly Extremities: extremities normal, atraumatic, no cyanosis or edema Skin: Skin color, texture, turgor normal. No rashes or lesions Lymph nodes: Cervical, supraclavicular, and axillary nodes normal. No abnormal inguinal nodes palpated Neurologic: Grossly normal   Pelvic: External genitalia:  no lesions              Urethra:  normal appearing urethra with no masses, tenderness or lesions              Bartholin's and Skene's: normal                 Vagina: normal appearing vagina with normal color and discharge, no lesions              Cervix: anteverted              Pap taken: No. Bimanual Exam:  Uterus:  normal size, contour, position, consistency, mobility, non-tender              Adnexa: no mass, fullness, tenderness  Rectovaginal: Confirms               Anus:  normal sphincter tone, no lesions  Chaperone present: no  A:  Well Woman with normal exam  Postmenopausal - no HRT FMH heart disease Vit D deficiency   P:   Reviewed health and wellness pertinent to exam  Pap smear as above  Mammogram is due 11/12017  Will follow with labs  Refill on Vit D and will follow with labs  Counseled on breast self exam, mammography screening, adequate intake of calcium and vitamin D, diet and exercise, Kegel's exercises return annually or prn  An After Visit Summary was printed and given to the patient.

## 2016-03-05 NOTE — Telephone Encounter (Signed)
Patient coming today at [id number]. Wants fasting labs.  Routing to Ria CommentPatricia Grubb, FNP

## 2016-03-05 NOTE — Telephone Encounter (Signed)
Patient requesting to have fasting labs done today at her appointment. She's scheduled for 1:45 and will need orders.

## 2016-03-05 NOTE — Patient Instructions (Addendum)

## 2016-03-06 LAB — COMPREHENSIVE METABOLIC PANEL
ALT: 12 U/L (ref 6–29)
AST: 15 U/L (ref 10–35)
Albumin: 4.3 g/dL (ref 3.6–5.1)
Alkaline Phosphatase: 42 U/L (ref 33–130)
BILIRUBIN TOTAL: 0.4 mg/dL (ref 0.2–1.2)
BUN: 17 mg/dL (ref 7–25)
CO2: 27 mmol/L (ref 20–31)
CREATININE: 0.78 mg/dL (ref 0.50–0.99)
Calcium: 9.2 mg/dL (ref 8.6–10.4)
Chloride: 105 mmol/L (ref 98–110)
GLUCOSE: 103 mg/dL — AB (ref 65–99)
Potassium: 4.3 mmol/L (ref 3.5–5.3)
SODIUM: 141 mmol/L (ref 135–146)
Total Protein: 6.6 g/dL (ref 6.1–8.1)

## 2016-03-06 LAB — TSH: TSH: 1.39 m[IU]/L

## 2016-03-06 LAB — LIPID PANEL
CHOLESTEROL: 167 mg/dL (ref 125–200)
HDL: 70 mg/dL (ref >=46)
LDL Cholesterol: 85 mg/dL (ref <130)
Total CHOL/HDL Ratio: 2.4 Ratio (ref <=5.0)
Triglycerides: 60 mg/dL (ref <150)
VLDL: 12 mg/dL (ref <30)

## 2016-03-06 LAB — VITAMIN D 25 HYDROXY (VIT D DEFICIENCY, FRACTURES): VIT D 25 HYDROXY: 39 ng/mL (ref 30–100)

## 2016-03-06 LAB — HEPATITIS C ANTIBODY: HCV Ab: NEGATIVE

## 2016-03-09 NOTE — Progress Notes (Signed)
Encounter reviewed by Dr. Brook Amundson C. Silva.  

## 2016-04-18 ENCOUNTER — Telehealth: Payer: Self-pay | Admitting: Nurse Practitioner

## 2016-04-18 MED ORDER — VITAMIN D3 1.25 MG (50000 UT) PO CAPS: 1.0000 | ORAL_CAPSULE | ORAL | Status: DC

## 2016-04-18 NOTE — Telephone Encounter (Signed)
Patient has lost her prescription for Vit D.  She would like to pick it up if possible. She also has some questions regarding a list of things she was suppose to do.

## 2016-04-18 NOTE — Telephone Encounter (Signed)
Spoke with patient. Patient states that Kayla CommentPatricia Grubb, FNP wrote her an rx for her Vitamin D, but she has misplaced the prescription. Asking if a new rx can be written for her to pick up. Rx for Vitamin D 50,000 IU take 1 tablet every 7 days #30 3RF printed for Kayla CommentPatricia Grubb, FNP review and signature. Advised this prescription will be placed at the front desk for pick up. She is agreeable. Patient also has questions about her AVS. All questions answered. She is agreeable.

## 2016-10-09 ENCOUNTER — Telehealth: Payer: Self-pay | Admitting: Nurse Practitioner

## 2016-10-09 NOTE — Telephone Encounter (Signed)
Patient called requesting an order for an "annual 3-D screening mammogram" be sent to [facility]. She said, "My insurance company requires this to cover the annual 3-D mammogram or they will consider it diagnostic and not cover it. I am not having any problems."  Patient requests a call back to confirm this has been done prior to her appointment on 10/12/16

## 2016-10-09 NOTE — Telephone Encounter (Signed)
Spoke with patient. Advised order sent to [facility] for 3D Screening MMG as requested. Patient is thankful and verbalizes understanding.  Routing to provider for final review. Patient is agreeable to disposition. Will close encounter.

## 2016-10-24 ENCOUNTER — Encounter: Payer: Self-pay | Admitting: Nurse Practitioner

## 2017-03-04 ENCOUNTER — Telehealth: Payer: Self-pay | Admitting: Nurse Practitioner

## 2017-03-04 DIAGNOSIS — Z0000 Encounter for general adult medical examination without abnormal findings: Secondary | ICD-10-CM

## 2017-03-04 NOTE — Telephone Encounter (Signed)
Patient is requesting fasting labs. She is scheduled on 03/06/17 for her annual appointment. Will she need an order.

## 2017-03-04 NOTE — Telephone Encounter (Signed)
Agree with these orders.

## 2017-03-04 NOTE — Telephone Encounter (Signed)
Future lab orders for CBC, CMP, TSH, Vitamin D, and lipid panel placed.  Routing to Ria Comment, FNP for review and for any additional labs to be added if needed.

## 2017-03-05 NOTE — Progress Notes (Signed)
71 y.o. G0P0 Single  Caucasian Fe here for annual exam.  Hit right knee and saw PCP.  Had XRAY done showing OA of knees.  She feels well otherwise.  Patient's last menstrual period was 07/13/2000.          Sexually active: No.  The current method of family planning is post menopausal status.    Exercising: Yes.    swimming and walking Smoker:  no  Health Maintenance: Pap: 03/03/15, Negative  12/20/11, Negative with neg HR HPV History of Abnormal Pap: no MMG: 10/12/16, 3D, Bi-Rads 1:  Negative Self Breast exams: yes Colonoscopy: 03/21/15, Normal, repeat in 10 years BMD: 03/07/15 T Score: -0.5 Spine / 0.0 Right Femur Neck / -0.2 Left Femur Neck, scheduled for 03/07/17 TDaP: 01/15/07 Shingles: 11/28/11 Pneumonia: 10/10/16, 02/23/14 (both Prevnar-13) HIV: 03/05/16 Labs: HB: 12.7 and fasting labs today   reports that she has never smoked. She has never used smokeless tobacco. She reports that she does not drink alcohol or use drugs.  Past Medical History:  Diagnosis Date  . Anemia     Past Surgical History:  Procedure Laterality Date  . APPENDECTOMY  age 45  . TONSILLECTOMY AND ADENOIDECTOMY  age 60    Current Outpatient Prescriptions  Medication Sig Dispense Refill  . Cholecalciferol (VITAMIN D3) 50000 units CAPS Take 1 capsule by mouth once a week. 30 capsule 3   No current facility-administered medications for this visit.     Family History  Problem Relation Age of Onset  . Osteoporosis Mother   . Celiac disease Sister   . Cancer Maternal Grandfather     unknown, heavy smoker    ROS:  Pertinent items are noted in HPI.  Otherwise, a comprehensive ROS was negative.  Exam:   BP 116/64 (BP Location: Right Arm, Patient Position: Sitting, Cuff Size: Normal)   Pulse 60   Ht 5' 5.25" (1.657 m)   Wt 164 lb (74.4 kg)   LMP 07/13/2000   BMI 27.08 kg/m  Height: 5' 5.25" (165.7 cm) Ht Readings from Last 3 Encounters:  03/06/17 5' 5.25" (1.657 m)  03/05/16 5' 5.25" (1.657 m)   03/03/15 5' 5.5" (1.664 m)    General appearance: alert, cooperative and appears stated age Head: Normocephalic, without obvious abnormality, atraumatic Neck: no adenopathy, supple, symmetrical, trachea midline and thyroid normal to inspection and palpation Lungs: clear to auscultation bilaterally Breasts: normal appearance, no masses or tenderness Heart: regular rate and rhythm Abdomen: soft, non-tender; no masses,  no organomegaly Extremities: extremities normal, atraumatic, no cyanosis or edema Skin: Skin color, texture, turgor normal. No rashes or lesions Lymph nodes: Cervical, supraclavicular, and axillary nodes normal. No abnormal inguinal nodes palpated Neurologic: Grossly normal   Pelvic: External genitalia:  no lesions              Urethra:  normal appearing urethra with no masses, tenderness or lesions              Bartholin's and Skene's: normal                 Vagina: normal appearing vagina with normal color and discharge, no lesions              Cervix: anteverted              Pap taken: Yes.   Bimanual Exam:  Uterus:  normal size, contour, position, consistency, mobility, non-tender              Adnexa: no  mass, fullness, tenderness               Rectovaginal: Confirms               Anus:  normal sphincter tone, no lesions  Chaperone present: yes  A:  Well Woman with normal exam Postmenopausal - no HRT FMH heart disease Vit D deficiency  Update TDaP  P:   Reviewed health and wellness pertinent to exam  Pap smear: yes  Mammogram is due 10/2017  Counseled on breast self exam, mammography screening, adequate intake of calcium and vitamin D, diet and exercise, Kegel's exercises return annually or prn  An After Visit Summary was printed and given to the patient.

## 2017-03-06 ENCOUNTER — Ambulatory Visit (INDEPENDENT_AMBULATORY_CARE_PROVIDER_SITE_OTHER): Payer: Medicare Other | Admitting: Nurse Practitioner

## 2017-03-06 ENCOUNTER — Encounter: Payer: Self-pay | Admitting: Nurse Practitioner

## 2017-03-06 VITALS — BP 116/64 | HR 60 | Ht 65.25 in | Wt 164.0 lb

## 2017-03-06 DIAGNOSIS — Z01411 Encounter for gynecological examination (general) (routine) with abnormal findings: Secondary | ICD-10-CM

## 2017-03-06 DIAGNOSIS — Z0000 Encounter for general adult medical examination without abnormal findings: Secondary | ICD-10-CM

## 2017-03-06 DIAGNOSIS — E559 Vitamin D deficiency, unspecified: Secondary | ICD-10-CM

## 2017-03-06 DIAGNOSIS — Z23 Encounter for immunization: Secondary | ICD-10-CM | POA: Diagnosis not present

## 2017-03-06 MED ORDER — VITAMIN D3 1.25 MG (50000 UT) PO CAPS: 1.0000 | ORAL_CAPSULE | ORAL | 3 refills | Status: DC

## 2017-03-06 NOTE — Patient Instructions (Addendum)

## 2017-03-07 ENCOUNTER — Other Ambulatory Visit: Payer: Self-pay | Admitting: Nurse Practitioner

## 2017-03-07 LAB — CBC
HEMATOCRIT: 40.5 % (ref 35.0–45.0)
HEMOGLOBIN: 13.2 g/dL (ref 11.7–15.5)
MCH: 30 pg (ref 27.0–33.0)
MCHC: 32.6 g/dL (ref 32.0–36.0)
MCV: 92 fL (ref 80.0–100.0)
MPV: 9.4 fL (ref 7.5–12.5)
Platelets: 201 10*3/uL (ref 140–400)
RBC: 4.4 MIL/uL (ref 3.80–5.10)
RDW: 13.5 % (ref 11.0–15.0)
WBC: 3.6 10*3/uL — AB (ref 3.8–10.8)

## 2017-03-07 LAB — COMPREHENSIVE METABOLIC PANEL
ALBUMIN: 4.1 g/dL (ref 3.6–5.1)
ALK PHOS: 44 U/L (ref 33–130)
ALT: 12 U/L (ref 6–29)
AST: 14 U/L (ref 10–35)
BILIRUBIN TOTAL: 0.3 mg/dL (ref 0.2–1.2)
BUN: 11 mg/dL (ref 7–25)
CALCIUM: 8.9 mg/dL (ref 8.6–10.4)
CO2: 26 mmol/L (ref 20–31)
Chloride: 106 mmol/L (ref 98–110)
Creat: 0.92 mg/dL (ref 0.50–0.99)
Glucose, Bld: 103 mg/dL — ABNORMAL HIGH (ref 65–99)
Potassium: 3.9 mmol/L (ref 3.5–5.3)
Sodium: 141 mmol/L (ref 135–146)
Total Protein: 6.1 g/dL (ref 6.1–8.1)

## 2017-03-07 LAB — LIPID PANEL
CHOLESTEROL: 169 mg/dL (ref <200)
HDL: 57 mg/dL (ref >50)
LDL Cholesterol: 93 mg/dL (ref <100)
TRIGLYCERIDES: 95 mg/dL (ref <150)
Total CHOL/HDL Ratio: 3 Ratio (ref <5.0)
VLDL: 19 mg/dL (ref <30)

## 2017-03-07 LAB — TSH: TSH: 1.58 m[IU]/L

## 2017-03-07 NOTE — Progress Notes (Signed)
Encounter reviewed by Dr. Rosealynn Mateus Amundson C. Silva.  

## 2017-03-08 LAB — VITAMIN D 25 HYDROXY (VIT D DEFICIENCY, FRACTURES): Vit D, 25-Hydroxy: 41 ng/mL (ref 30–100)

## 2017-03-08 LAB — IPS PAP SMEAR ONLY

## 2017-03-09 LAB — HEMOGLOBIN A1C
Hgb A1c MFr Bld: 5.1 % (ref <5.7)
Mean Plasma Glucose: 100 mg/dL

## 2017-03-11 ENCOUNTER — Telehealth: Payer: Self-pay

## 2017-03-11 NOTE — Telephone Encounter (Signed)
-----   Message from Ria Comment, FNP sent at 03/11/2017  8:08 AM EDT ----- Please let pt know that Vit D was normal at 41 - (continue same dose), TSH, lipid, CBC, and CMP were all normal except a slight elevated blood sugar.  We then did HGB AIC which is very normal at 5.1.  (this is reported on separate lab).  Pap is normal 02 recall.

## 2017-03-11 NOTE — Telephone Encounter (Signed)
Left message to call Kaitlyn at [phone]. 

## 2017-03-12 NOTE — Telephone Encounter (Signed)
Spoke with patient. Advised of results as seen below from Ria Comment, FNP. Patient is agreeable an verbalizes understanding. 02 recall placed. Patient requests lab work be sent to her home address on file. Verbal request for release of medical records completed and to [facility].  Routing to provider for final review. Patient agreeable to disposition. Will close encounter.

## 2017-03-22 ENCOUNTER — Telehealth: Payer: Self-pay | Admitting: Nurse Practitioner

## 2017-03-22 NOTE — Telephone Encounter (Signed)
Please let pt know that BMD done on 03/07/17 shows that she is normal at spine and both hips.  Compared to previous study of 03/06/05 she did have a little loss of -3% at the spine and -6% change at the right hip.  She still needs to maintain exercise, calcium and Vit D.  Repeat again in 3-4 yrs.

## 2017-03-29 NOTE — Telephone Encounter (Signed)
Patient notified of bone density results.  She is appreciative of results and requests copy. Verbal Request for Release of Medical Records is completed and given to Greta to mail records. Address verified with patient.  Hard copy of report sent to scan.  Closing encounter.

## 2017-03-29 NOTE — Telephone Encounter (Signed)
I have attempted to contact this patient by phone with the following results: left message to return call to DemarestStephanie at [phone] on answering machine ([facility] Advised call was regarding recent bone density exam. [phone] (Home) *Preferred*

## 2017-04-10 ENCOUNTER — Encounter: Payer: Self-pay | Admitting: Nurse Practitioner

## 2017-09-10 ENCOUNTER — Telehealth: Payer: Self-pay | Admitting: Obstetrics and Gynecology

## 2017-09-10 NOTE — Telephone Encounter (Signed)
Order signed and sent back to you.

## 2017-09-10 NOTE — Telephone Encounter (Signed)
Signed order faxed to [facility]. Patient has been notified. Encounter closed.

## 2017-09-10 NOTE — Telephone Encounter (Signed)
Patient states that our office always sends a request to [facility] for her to have a 3D mammogram. Patient stated that the 3D mammogram would only be covered by her insurance if our office sends a request.

## 2017-09-10 NOTE — Telephone Encounter (Signed)
Order for 3D bilateral screening mammogram to Dr.Silva for review before faxing.

## 2017-09-20 ENCOUNTER — Telehealth: Payer: Self-pay | Admitting: Obstetrics and Gynecology

## 2017-09-20 NOTE — Telephone Encounter (Signed)
Patient called requesting a written prescription for vitamin D be mailed to her home address on file. She is researching local pharmacies for the lowest cost and wants to take the prescription in herself once she decides on one.

## 2017-09-20 NOTE — Telephone Encounter (Signed)
I have just reviewed the patient's vitamin D levels from 6 and 12 months ago, and they were normal.  Vitamin D supplementation recommendations have changed recently.  High dose vitamin D supplementation is not recommended at her most recent levels.  I recommend she take over the counter vitamin D 800 IU daily.  She can have this rechecked at her next annual exam.

## 2017-09-20 NOTE — Telephone Encounter (Signed)
Left message to call Aseneth Hack at [phone].  

## 2017-09-20 NOTE — Telephone Encounter (Signed)
Returned call to patient, left message. Advised request received, will review with provider and return call with recommendations. May return call to [phone] with any additional questions.  Last Vitamin D:41 on 03/07/17   [facility]ifiy remaining refills, closed, opens at 10am.

## 2017-09-20 NOTE — Telephone Encounter (Signed)
Spoke with patient. Patient states she misplaced RX provided on 03/06/17, never took it to pharmacy, only has a few Vitamin D left. Reports she is taking 50,000 IU weekly. Requesting written RX.   Advised patient would review with Dr. Edward JollySilva and return call with recommendations. Rx would need to be picked up from office. Patient thankful and agreeable.  Last Vit D 41 on 03/07/17.  Dr. Edward JollySilva -please advise on RX for Vitamin D 50,000 IU?

## 2017-09-23 NOTE — Telephone Encounter (Signed)
Attempted home and mobile numbers on file. Left message to call Noreene LarssonJill at [phone].

## 2017-09-23 NOTE — Telephone Encounter (Signed)
Spoke with patient, advised as seen below per Dr. Edward JollySilva. Patient thankful for f/u, verbalizes understanding and is agreeable.  Routing to provider for final review. Patient is agreeable to disposition. Will close encounter.

## 2017-10-28 ENCOUNTER — Encounter: Payer: Self-pay | Admitting: Obstetrics and Gynecology

## 2018-03-10 ENCOUNTER — Ambulatory Visit: Payer: Medicare Other | Admitting: Nurse Practitioner

## 2018-03-10 ENCOUNTER — Ambulatory Visit (INDEPENDENT_AMBULATORY_CARE_PROVIDER_SITE_OTHER): Payer: Medicare Other | Admitting: Obstetrics and Gynecology

## 2018-03-10 ENCOUNTER — Encounter: Payer: Self-pay | Admitting: Obstetrics and Gynecology

## 2018-03-10 ENCOUNTER — Other Ambulatory Visit: Payer: Self-pay

## 2018-03-10 VITALS — BP 110/58 | HR 80 | Resp 16 | Ht 65.25 in | Wt 166.0 lb

## 2018-03-10 DIAGNOSIS — Z01419 Encounter for gynecological examination (general) (routine) without abnormal findings: Secondary | ICD-10-CM

## 2018-03-10 NOTE — Patient Instructions (Signed)

## 2018-03-10 NOTE — Progress Notes (Signed)
72 y.o. G0P0000 Single Caucasian female here for annual exam.    Stopped prescription vitamin D.   Retired Programmer, systems.  Committed to safety in school.  She has leg and feet cramps.   PCP: Pamelia Hoit, MD    Patient's last menstrual period was 07/13/2000.           Sexually active: No.  The current method of family planning is post menopausal status.    Exercising: Yes.    swimming and walking Smoker:  no  Health Maintenance: Pap:  03/06/17 Pap smear negative History of abnormal Pap:  no MMG:  10/15/17 BIRADS 1 negative/density b Colonoscopy:  03/21/15, Normal, repeat in 10 years BMD:   03/07/17  Result  Normal TDaP:  03/06/17 Gardasil:   n/a HIV:  Donated blood in the 1980s.  Hep C: 03/05/16 Negative Screening Labs:   PCP.   reports that she has never smoked. She has never used smokeless tobacco. She reports that she does not drink alcohol or use drugs.  Past Medical History:  Diagnosis Date  . Anemia     Past Surgical History:  Procedure Laterality Date  . APPENDECTOMY  age 70  . TONSILLECTOMY AND ADENOIDECTOMY  age 3    Current Outpatient Medications  Medication Sig Dispense Refill  . Calcium Citrate-Vitamin D (CITRACAL MAXIMUM PO) Take 1 tablet by mouth daily.    . Multiple Vitamins-Calcium (ONE-A-DAY WOMENS FORMULA PO) Take 1 tablet by mouth daily.     No current facility-administered medications for this visit.     Family History  Problem Relation Age of Onset  . Osteoporosis Mother   . Heart attack Father   . Celiac disease Sister   . Cancer Maternal Grandfather        unknown, heavy smoker  . Heart disease Paternal Grandfather     Review of Systems  Constitutional: Negative.   HENT: Negative.   Eyes: Negative.   Respiratory: Negative.   Cardiovascular: Negative.   Gastrointestinal: Negative.   Endocrine: Negative.   Genitourinary: Negative.   Musculoskeletal:       Cramp in calf and foot  Skin: Negative.   Allergic/Immunologic: Negative.    Neurological: Negative.   Hematological: Negative.   Psychiatric/Behavioral: Negative.     Exam:   BP (!) 110/58 (BP Location: Right Arm, Patient Position: Sitting, Cuff Size: Normal)   Pulse 80   Resp 16   Ht 5' 5.25" (1.657 m)   Wt 166 lb (75.3 kg)   LMP 07/13/2000   BMI 27.41 kg/m     General appearance: alert, cooperative and appears stated age Head: Normocephalic, without obvious abnormality, atraumatic Neck: no adenopathy, supple, symmetrical, trachea midline and thyroid normal to inspection and palpation Lungs: clear to auscultation bilaterally Breasts: normal appearance, no masses or tenderness, No nipple retraction or dimpling, No nipple discharge or bleeding, No axillary or supraclavicular adenopathy Heart: regular rate and rhythm Abdomen: soft, non-tender; no masses, no organomegaly Extremities: extremities normal, atraumatic, no cyanosis or edema Skin: Skin color, texture, turgor normal. No rashes or lesions Lymph nodes: Cervical, supraclavicular, and axillary nodes normal. No abnormal inguinal nodes palpated Neurologic: Grossly normal  Pelvic: External genitalia:  no lesions              Urethra:  normal appearing urethra with no masses, tenderness or lesions              Bartholins and Skenes: normal  Vagina: normal appearing vagina with normal color and discharge, no lesions.  Atrophy noted.               Cervix: no lesions              Pap taken: No. Bimanual Exam:  Uterus:  normal size, contour, position, consistency, mobility, non-tender              Adnexa: no mass, fullness, tenderness              Rectal exam: Yes.  .  Confirms.              Anus:  normal sphincter tone, no lesions  Chaperone was present for exam.  Assessment:   Well woman visit with normal exam. Leg cramping.   Hx low vit D.   Plan: Mammogram screening yearly.  Recommended self breast awareness. Pap next year.  Guidelines for Calcium, Vitamin D, regular exercise  program including cardiovascular and weight bearing exercise. Check vit D.  Routine labs with PCP.  We reviewed nocturnal leg cramps in Up To Date and supplements such as a B complex.   She is already doing this with her vitamin routine, and so she will see her PCP about her leg cramping.  Follow up annually and prn.   After visit summary provided.

## 2018-03-11 LAB — VITAMIN D 25 HYDROXY (VIT D DEFICIENCY, FRACTURES): VIT D 25 HYDROXY: 49.7 ng/mL (ref 30.0–100.0)

## 2018-03-13 ENCOUNTER — Other Ambulatory Visit: Payer: Medicare Other

## 2018-03-13 ENCOUNTER — Other Ambulatory Visit: Payer: Self-pay

## 2018-03-13 DIAGNOSIS — Z0000 Encounter for general adult medical examination without abnormal findings: Secondary | ICD-10-CM

## 2018-03-14 LAB — LIPID PANEL
CHOL/HDL RATIO: 2.9 ratio (ref 0.0–4.4)
CHOLESTEROL TOTAL: 148 mg/dL (ref 100–199)
HDL: 51 mg/dL (ref >39)
LDL CALC: 80 mg/dL (ref 0–99)
TRIGLYCERIDES: 84 mg/dL (ref 0–149)
VLDL CHOLESTEROL CAL: 17 mg/dL (ref 5–40)

## 2018-03-14 LAB — COMPREHENSIVE METABOLIC PANEL
A/G RATIO: 1.7 (ref 1.2–2.2)
ALK PHOS: 44 IU/L (ref 39–117)
ALT: 21 IU/L (ref 0–32)
AST: 19 IU/L (ref 0–40)
Albumin: 4 g/dL (ref 3.6–4.8)
BUN/Creatinine Ratio: 24 (ref 12–28)
BUN: 20 mg/dL (ref 8–27)
Bilirubin Total: 0.6 mg/dL (ref 0.0–1.2)
CHLORIDE: 107 mmol/L — AB (ref 96–106)
CO2: 18 mmol/L — ABNORMAL LOW (ref 20–29)
Calcium: 8.8 mg/dL (ref 8.7–10.3)
Creatinine, Ser: 0.84 mg/dL (ref 0.57–1.00)
GFR calc Af Amer: 82 mL/min/{1.73_m2} (ref >59)
GFR, EST NON AFRICAN AMERICAN: 71 mL/min/{1.73_m2} (ref >59)
Globulin, Total: 2.4 g/dL (ref 1.5–4.5)
Glucose: 92 mg/dL (ref 65–99)
POTASSIUM: 3.8 mmol/L (ref 3.5–5.2)
Sodium: 142 mmol/L (ref 134–144)
Total Protein: 6.4 g/dL (ref 6.0–8.5)

## 2018-03-14 LAB — CBC
Hematocrit: 41.3 % (ref 34.0–46.6)
Hemoglobin: 13.4 g/dL (ref 11.1–15.9)
MCH: 30.8 pg (ref 26.6–33.0)
MCHC: 32.4 g/dL (ref 31.5–35.7)
MCV: 95 fL (ref 79–97)
PLATELETS: 201 10*3/uL (ref 150–379)
RBC: 4.35 x10E6/uL (ref 3.77–5.28)
RDW: 13.4 % (ref 12.3–15.4)
WBC: 4.2 10*3/uL (ref 3.4–10.8)

## 2018-03-14 LAB — TSH: TSH: 1.31 u[IU]/mL (ref 0.450–4.500)

## 2018-03-14 LAB — HEMOGLOBIN A1C
Est. average glucose Bld gHb Est-mCnc: 105 mg/dL
HEMOGLOBIN A1C: 5.3 % (ref 4.8–5.6)

## 2018-03-14 LAB — VITAMIN D 25 HYDROXY (VIT D DEFICIENCY, FRACTURES): VIT D 25 HYDROXY: 53.3 ng/mL (ref 30.0–100.0)

## 2018-03-17 ENCOUNTER — Telehealth: Payer: Self-pay | Admitting: *Deleted

## 2018-03-17 NOTE — Telephone Encounter (Signed)
Notes recorded by Leda Min, RN on 03/17/2018 at 1:22 PM EDT Left message to call Noreene Larsson at [phone].

## 2018-03-17 NOTE — Telephone Encounter (Signed)
-----   Message from Patton Salles, MD sent at 03/14/2018  4:41 PM EDT ----- Please report results to patient.   The following test results were normal: thyroid complete blood counts vitamin D cholesterol  On her blood chemistries, her chloride was one point above normal, and her carbon dioxide was 2 points below normal. Overall her kidney function looks good, so these minor alterations are not of concern.

## 2018-03-18 NOTE — Telephone Encounter (Signed)
Patient returned call

## 2018-03-18 NOTE — Telephone Encounter (Signed)
Spoke with patient, advised as seen below per Dr. Edward Jolly. Patient request copy of labs be mailed to her and faxed to PCP/Dr. Andrey Campanile. Confirmed mailing address on file. Patient verbalizes understanding and is agreeable. Will close encounter.   Labs dated 03/13/18 faxed via Epic to Dr. Andrey Campanile. Copy of labs printed and placed in mail.

## 2018-03-18 NOTE — Telephone Encounter (Signed)
Returning call to Jill. °

## 2018-10-16 ENCOUNTER — Telehealth: Payer: Self-pay | Admitting: *Deleted

## 2018-10-16 NOTE — Telephone Encounter (Signed)
Late entry: call returned to patient, no answer, unable to leave message.

## 2018-10-16 NOTE — Telephone Encounter (Signed)
Late entry, Epic system down:  Patient spoke with Kayla D. Patient [facility], her appointment is today at 9:30am.

## 2018-10-27 NOTE — Telephone Encounter (Signed)
Left message to call Noreene LarssonJill, RN [facility] [phone].    [facility]. Screening MMG on 10/16/18. [facility].

## 2018-10-29 NOTE — Telephone Encounter (Signed)
Kayla Wang, did you receive MMG report? Patient has not returned call, ok to close encounter?

## 2018-10-29 NOTE — Telephone Encounter (Signed)
Copy of 10/16/18 [facility] MMG report received.  Bi-rads 1: negative  Copy of report placed in Dr. Rica RecordsSilva's folder for review.   Routing to Dr. Edward JollySilva. OK to close encounter? Patient called to request MMG order day of imaging, has not returned call.

## 2018-10-29 NOTE — Telephone Encounter (Signed)
Mammogram report received.  OK to close encounter.

## 2018-10-29 NOTE — Telephone Encounter (Signed)
Per review of Epic last AEX with Dr. Edward JollySilva on 03/10/18, next AEX 03/12/19 with Dr. Edward JollySilva.   [facility] request MMG report.

## 2018-10-29 NOTE — Telephone Encounter (Signed)
Do not remember seeing her report

## 2018-10-30 NOTE — Telephone Encounter (Signed)
Encounter closed per Dr. Silva. 

## 2019-03-12 ENCOUNTER — Ambulatory Visit: Payer: Medicare Other | Admitting: Obstetrics and Gynecology

## 2019-06-26 ENCOUNTER — Other Ambulatory Visit: Payer: Self-pay

## 2019-06-29 NOTE — Progress Notes (Signed)
73 y.o. G0P0000 Single Caucasian female here for annual exam.   Having some itching in the genital area.  No vaginal discharge. Wonders if it is the heat.   Patient requesting urinalysis and labs today She is not having pain with urination.  Drinks a lot of water daily.  Urine Dip: Trace WBCs, trace Protein.  Had cataract surgery and is seeing as well as she was 73 years old per patient.   She had her wellness visit with her PCP in the last month or so.  She will do a full visit with her PCP later this year.   PCP: Kathryne Eriksson, MD  Patient's last menstrual period was 07/13/2000.           Sexually active: No.  The current method of family planning is post menopausal status.    Exercising: Yes.    walking Smoker:  no  Health Maintenance: Pap: 03-06-17 Neg, 03-03-15 Neg History of abnormal Pap:  no MMG: 10-16-18 Neg/density B/Birads1 Colonoscopy:  03-21-15 normal;next 10 years BMD: 03-07-17  Result :Normal TDaP:  03-06-17 Gardasil:   N/A [id number] blood in 1980s Hep C:03-05-16 Neg Screening Labs:  PCP.  Shingrix:  Completed.    reports that she has never smoked. She has never used smokeless tobacco. She reports that she does not drink alcohol or use drugs.  Past Medical History:  Diagnosis Date  . Anemia     Past Surgical History:  Procedure Laterality Date  . APPENDECTOMY  age 43  . TONSILLECTOMY AND ADENOIDECTOMY  age 41    Current Outpatient Medications  Medication Sig Dispense Refill  . Calcium Citrate-Vitamin D (CITRACAL MAXIMUM PO) Take 1 tablet by mouth daily.    . Multiple Vitamins-Calcium (ONE-A-DAY WOMENS FORMULA PO) Take 1 tablet by mouth daily.     No current facility-administered medications for this visit.     Family History  Problem Relation Age of Onset  . Osteoporosis Mother   . Heart attack Father   . Celiac disease Sister   . Cancer Maternal Grandfather        unknown, heavy smoker  . Heart disease Paternal Grandfather     Review of  Systems  All other systems reviewed and are negative.   Exam:   BP 132/70   Pulse 68   Resp 16   Ht 5\' 5"  (1.651 m)   Wt 166 lb 3.2 oz (75.4 kg)   LMP 07/13/2000   BMI 27.66 kg/m     General appearance: alert, cooperative and appears stated age Head: normocephalic, without obvious abnormality, atraumatic Neck: no adenopathy, supple, symmetrical, trachea midline and thyroid normal to inspection and palpation Lungs: clear to auscultation bilaterally Breasts: normal appearance, no masses or tenderness, No nipple retraction or dimpling, No nipple discharge or bleeding, No axillary adenopathy Heart: regular rate and rhythm Abdomen: soft, non-tender; no masses, no organomegaly Extremities: extremities normal, atraumatic, no cyanosis or edema Skin: skin color, texture, turgor normal. No rashes or lesions Lymph nodes: cervical, supraclavicular, and axillary nodes normal. Neurologic: grossly normal  Pelvic: External genitalia:  no lesions              No abnormal inguinal nodes palpated.              Urethra:  normal appearing urethra with no masses, tenderness or lesions              Bartholins and Skenes: normal  Vagina: mucosa with erythema and orange yellow discharge.  No lesions.              Cervix: no lesions              Pap taken: Yes.   Bimanual Exam:  Uterus:  normal size, contour, position, consistency, mobility, non-tender              Adnexa: no mass, fullness, tenderness              Rectal exam: Yes.  .  Confirms.              Anus:  normal sphincter tone, no lesions  Chaperone was present for exam.  Assessment:   Well woman visit with normal exam. Atrophic vaginitis.    Plan: Mammogram screening discussed. Self breast awareness reviewed. Pap and HR HPV as above. Guidelines for Calcium, Vitamin D, regular exercise program including cardiovascular and weight bearing exercise. Labs with PCP.  We discussed atrophy and treatment options - water based  lubricants, cooking oils, and vaginal local estrogen.  Will try Premarin vaginal cream.  Instructed in use.  I discussed potential effect on breast cancer.  Follow up annually and prn.    After visit summary provided.

## 2019-06-30 ENCOUNTER — Other Ambulatory Visit (HOSPITAL_COMMUNITY)
Admission: RE | Admit: 2019-06-30 | Discharge: 2019-06-30 | Disposition: A | Discharge status: Home / Self Care | Payer: Medicare Other | Source: Ambulatory Visit | Attending: Obstetrics and Gynecology | Admitting: Obstetrics and Gynecology

## 2019-06-30 ENCOUNTER — Encounter: Payer: Self-pay | Admitting: Obstetrics and Gynecology

## 2019-06-30 ENCOUNTER — Other Ambulatory Visit: Payer: Self-pay

## 2019-06-30 ENCOUNTER — Ambulatory Visit (INDEPENDENT_AMBULATORY_CARE_PROVIDER_SITE_OTHER): Payer: Medicare Other | Admitting: Obstetrics and Gynecology

## 2019-06-30 VITALS — BP 132/70 | HR 68 | Resp 16 | Ht 65.0 in | Wt 166.2 lb

## 2019-06-30 DIAGNOSIS — Z01419 Encounter for gynecological examination (general) (routine) without abnormal findings: Secondary | ICD-10-CM | POA: Insufficient documentation

## 2019-06-30 DIAGNOSIS — Z20822 Contact with and (suspected) exposure to covid-19: Secondary | ICD-10-CM

## 2019-06-30 DIAGNOSIS — Z0000 Encounter for general adult medical examination without abnormal findings: Secondary | ICD-10-CM | POA: Diagnosis not present

## 2019-06-30 LAB — POCT URINALYSIS DIPSTICK
Bilirubin, UA: NEGATIVE
Blood, UA: NEGATIVE
Glucose, UA: NEGATIVE
Ketones, UA: NEGATIVE
Nitrite, UA: NEGATIVE
Protein, UA: POSITIVE — AB
Urobilinogen, UA: 0.2 E.U./dL
pH, UA: 5 (ref 5.0–8.0)

## 2019-06-30 MED ORDER — PREMARIN 0.625 MG/GM VA CREA: TOPICAL_CREAM | VAGINAL | 2 refills | Status: DC

## 2019-06-30 NOTE — Patient Instructions (Signed)
EXERCISE AND DIET:  We recommended that you start or continue a regular exercise program for good health. Regular exercise means any activity that makes your heart beat faster and makes you sweat.  We recommend exercising at least 30 minutes per day at least 3 days a week, preferably 4 or 5.  We also recommend a diet low in fat and sugar.  Inactivity, poor dietary choices and obesity can cause diabetes, heart attack, stroke, and kidney damage, among others.    ALCOHOL AND SMOKING:  Women should limit their alcohol intake to no more than 7 drinks/beers/glasses of wine (combined, not each!) per week. Moderation of alcohol intake to this level decreases your risk of breast cancer and liver damage. And of course, no recreational drugs are part of a healthy lifestyle.  And absolutely no smoking or even second hand smoke. Most people know smoking can cause heart and lung diseases, but did you know it also contributes to weakening of your bones? Aging of your skin?  Yellowing of your teeth and nails?  CALCIUM AND VITAMIN D:  Adequate intake of calcium and Vitamin D are recommended.  The recommendations for exact amounts of these supplements seem to change often, but generally speaking 600 mg of calcium (either carbonate or citrate) and 800 units of Vitamin D per day seems prudent. Certain women may benefit from higher intake of Vitamin D.  If you are among these women, your doctor will have told you during your visit.    PAP SMEARS:  Pap smears, to check for cervical cancer or precancers,  have traditionally been done yearly, although recent scientific advances have shown that most women can have pap smears less often.  However, every woman still should have a physical exam from her gynecologist every year. It will include a breast check, inspection of the vulva and vagina to check for abnormal growths or skin changes, a visual exam of the cervix, and then an exam to evaluate the size and shape of the uterus and  ovaries.  And after 71 years of age, a rectal exam is indicated to check for rectal cancers. We will also provide age appropriate advice regarding health maintenance, like when you should have certain vaccines, screening for sexually transmitted diseases, bone density testing, colonoscopy, mammograms, etc.   MAMMOGRAMS:  All women over 40 years old should have a yearly mammogram. Many facilities now offer a "3D" mammogram, which may cost around $50 extra out of pocket. If possible,  we recommend you accept the option to have the 3D mammogram performed.  It both reduces the number of women who will be called back for extra views which then turn out to be normal, and it is better than the routine mammogram at detecting truly abnormal areas.    COLONOSCOPY:  Colonoscopy to screen for colon cancer is recommended for all women at age 50.  We know, you hate the idea of the prep.  We agree, BUT, having colon cancer and not knowing it is worse!!  Colon cancer so often starts as a polyp that can be seen and removed at colonscopy, which can quite literally save your life!  And if your first colonoscopy is normal and you have no family history of colon cancer, most women don't have to have it again for 10 years.  Once every ten years, you can do something that may end up saving your life, right?  We will be happy to help you get it scheduled when you are ready.    Be sure to check your insurance coverage so you understand how much it will cost.  It may be covered as a preventative service at no cost, but you should check your particular policy.       Atrophic Vaginitis  Atrophic vaginitis is a condition in which the tissues that line the vagina become dry and thin. This condition is most common in women who have stopped having regular menstrual periods (are in menopause). This usually starts when a woman is 45-55 years old. That is the time when a woman's estrogen levels begin to drop (decrease). Estrogen is a female  hormone. It helps to keep the tissues of the vagina moist. It stimulates the vagina to produce a clear fluid that lubricates the vagina for sexual intercourse. This fluid also protects the vagina from infection. Lack of estrogen can cause the lining of the vagina to get thinner and dryer. The vagina may also shrink in size. It may become less elastic. Atrophic vaginitis tends to get worse over time as a woman's estrogen level drops. What are the causes? This condition is caused by the normal drop in estrogen that happens around the time of menopause. What increases the risk? Certain conditions or situations may lower a woman's estrogen level, leading to a higher risk for atrophic vaginitis. You are more likely to develop this condition if:  You are taking medicines that block estrogen.  You have had your ovaries removed.  You are being treated for cancer with X-ray (radiation) or medicines (chemotherapy).  You have given birth or are breastfeeding.  You are older than age 50.  You smoke. What are the signs or symptoms? Symptoms of this condition include:  Pain, soreness, or bleeding during sexual intercourse (dyspareunia).  Vaginal burning, irritation, or itching.  Pain or bleeding when a speculum is used in a vaginal exam (pelvic exam).  Having burning pain when passing urine.  Vaginal discharge that is brown or yellow. In some cases, there are no symptoms. How is this diagnosed? This condition is diagnosed by taking a medical history and doing a physical exam. This will include a pelvic exam that checks the vaginal tissues. Though rare, you may also have other tests, including:  A urine test.  A test that checks the acid balance in your vagina (acid balance test). How is this treated? Treatment for this condition depends on how severe your symptoms are. Treatment may include:  Using an over-the-counter vaginal lubricant before sex.  Using a long-acting vaginal moisturizer.   Using low-dose vaginal estrogen for moderate to severe symptoms that do not respond to other treatments. Options include creams, tablets, and inserts (vaginal rings). Before you use a vaginal estrogen, tell your health care provider if you have a history of: ? Breast cancer. ? Endometrial cancer. ? Blood clots. If you are not sexually active and your symptoms are very mild, you may not need treatment. Follow these instructions at home: Medicines  Take over-the-counter and prescription medicines only as told by your health care provider. Do not use herbal or alternative medicines unless your health care provider says that you can.  Use over-the-counter creams, lubricants, or moisturizers for dryness only as directed by your health care provider. General instructions  If your atrophic vaginitis is caused by menopause, discuss all of your menopause symptoms and treatment options with your health care provider.  Do not douche.  Do not use products that can make your vagina dry. These include: ? Scented feminine sprays. ? Scented   tampons. ? Scented soaps.  Vaginal intercourse can help to improve blood flow and elasticity of vaginal tissue. If it hurts to have sex, try using a lubricant or moisturizer just before having intercourse. Contact a health care provider if:  Your discharge looks different than normal.  Your vagina has an unusual smell.  You have new symptoms.  Your symptoms do not improve with treatment.  Your symptoms get worse. Summary  Atrophic vaginitis is a condition in which the tissues that line the vagina become dry and thin. It is most common in women who have stopped having regular menstrual periods (are in menopause).  Treatment options include using vaginal lubricants and low-dose vaginal estrogen.  Contact a health care provider if your vagina has an unusual smell, or if your symptoms get worse or do not improve after treatment. This information is not  intended to replace advice given to you by your health care provider. Make sure you discuss any questions you have with your health care provider. Document Released: 03/15/2015 Document Revised: 10/11/2017 Document Reviewed: 07/25/2017 Elsevier Patient Education  2020 [employer]  

## 2019-07-01 LAB — CYTOLOGY - PAP: Diagnosis: NEGATIVE

## 2019-07-01 LAB — NOVEL CORONAVIRUS, NAA: SARS-CoV-2, NAA: NOT DETECTED

## 2019-08-19 ENCOUNTER — Ambulatory Visit: Payer: Medicare Other | Admitting: Obstetrics and Gynecology

## 2019-08-19 ENCOUNTER — Encounter

## 2019-10-15 ENCOUNTER — Telehealth: Payer: Self-pay | Admitting: Obstetrics and Gynecology

## 2019-10-15 NOTE — Telephone Encounter (Signed)
Patient says she need order for 3d mammogram sent to [facility].

## 2019-10-15 NOTE — Telephone Encounter (Signed)
Spoke with patient. Patient denies any breast concerns, requesting order for bilateral 3D screening MMG to [facility]. Patient is aware order not needed for screening MMG, request order to be faxed and notified once sent.   Last AEX 06/30/19 Last MMG 10/16/18  Order to Dr. Quincy Simmonds for 3D bilateral screening MMG.

## 2019-10-15 NOTE — Telephone Encounter (Signed)
Order faxed to [facility].   Call returned to patient. Left detailed message, ok per dpr. Advised order has been faxed to [facility] for screening MMG, she may contact them directly at her convenience to schedule. Return call to office if any additional questions.   Routing to provider for final review. Patient is agreeable to disposition. Will close encounter.

## 2019-10-19 ENCOUNTER — Encounter: Payer: Self-pay | Admitting: Obstetrics and Gynecology

## 2019-11-11 ENCOUNTER — Ambulatory Visit: Payer: BC Managed Care – PPO | Attending: Internal Medicine

## 2019-11-11 DIAGNOSIS — Z20822 Contact with and (suspected) exposure to covid-19: Secondary | ICD-10-CM

## 2019-11-12 LAB — NOVEL CORONAVIRUS, NAA: SARS-CoV-2, NAA: NOT DETECTED

## 2019-11-20 ENCOUNTER — Ambulatory Visit: Payer: BC Managed Care – PPO | Attending: Internal Medicine

## 2019-11-20 DIAGNOSIS — Z20822 Contact with and (suspected) exposure to covid-19: Secondary | ICD-10-CM

## 2019-11-22 LAB — NOVEL CORONAVIRUS, NAA: SARS-CoV-2, NAA: NOT DETECTED

## 2019-12-03 ENCOUNTER — Ambulatory Visit: Payer: Medicare PPO | Attending: Internal Medicine

## 2019-12-03 DIAGNOSIS — Z23 Encounter for immunization: Secondary | ICD-10-CM

## 2019-12-03 NOTE — Progress Notes (Signed)
   Covid-19 Vaccination Clinic  Name:  Kayla Wang    MRN: [id number] DOB: [birth year]  12/03/2019  Kayla Wang was observed post Covid-19 immunization for 15 minutes without incidence. She was provided with Vaccine Information Sheet and instruction to access the V-Safe system.   Kayla Wang was instructed to call 911 with any severe reactions post vaccine: Marland Kitchen Difficulty breathing  . Swelling of your face and throat  . A fast heartbeat  . A bad rash all over your body  . Dizziness and weakness    Immunizations Administered    Name Date Dose VIS Date Route   Pfizer COVID-19 Vaccine 12/03/2019  8:37 AM 0.3 mL 10/23/2019 Intramuscular   Manufacturer: [employer], [employer]   Lot: [id number]   NDC: [address]-7

## 2019-12-24 ENCOUNTER — Ambulatory Visit: Payer: Medicare PPO | Attending: Internal Medicine

## 2019-12-24 DIAGNOSIS — Z23 Encounter for immunization: Secondary | ICD-10-CM

## 2020-06-30 ENCOUNTER — Other Ambulatory Visit: Payer: Self-pay

## 2020-06-30 ENCOUNTER — Encounter: Payer: Self-pay | Admitting: Obstetrics and Gynecology

## 2020-06-30 ENCOUNTER — Ambulatory Visit (INDEPENDENT_AMBULATORY_CARE_PROVIDER_SITE_OTHER): Payer: Medicare PPO | Admitting: Obstetrics and Gynecology

## 2020-06-30 VITALS — BP 118/64 | HR 68 | Resp 12 | Ht 65.0 in | Wt 164.0 lb

## 2020-06-30 DIAGNOSIS — Z01419 Encounter for gynecological examination (general) (routine) without abnormal findings: Secondary | ICD-10-CM

## 2020-06-30 MED ORDER — ESTRADIOL 0.1 MG/GM VA CREA: TOPICAL_CREAM | VAGINAL | 1 refills | Status: DC

## 2020-06-30 NOTE — Progress Notes (Signed)
74 y.o. G0P0000 Single Caucasian female here for annual exam.    No concerns.  No vaginal bleeding.  Not using vaginal estrogen cream.  Ran out.  No vaginal itching or irritation or burning.  Not SA.   Weighs more than she would like.   Received her Covid vaccination.  PCP: Benedetto Goad, MD  Patient's last menstrual period was 07/13/2000.           Sexually active: No.  The current method of family planning is post menopausal status.    Exercising: Yes.    Walking and swimming Smoker:  no  Health Maintenance: Pap:  06-30-19 Neg, 03-06-17 Neg, 03-03-15 Neg History of abnormal Pap:  no MMG: 10-19-19 3D/Benign focal asymmetry Rt.Br./Neg/density B/screening 56yr/BiRads2 Colonoscopy:  03-21-15 normal;next 10 years BMD: 03-07-17  Result :Normal TDaP:  03-06-17 Gardasil:   no ASN:[id number] blood in 1980s Hep C:03-05-16 Neg Screening Labs:  PCP. Shingrix:  Completed Thinks she has completed her Pneumonia vaccine.    reports that she has never smoked. She has never used smokeless tobacco. She reports that she does not drink alcohol and does not use drugs.  Past Medical History:  Diagnosis Date  . Anemia     Past Surgical History:  Procedure Laterality Date  . APPENDECTOMY  age 37  . TONSILLECTOMY AND ADENOIDECTOMY  age 80    Current Outpatient Medications  Medication Sig Dispense Refill  . Calcium Citrate-Vitamin D (CITRACAL MAXIMUM PO) Take 1 tablet by mouth daily.    . Multiple Vitamins-Calcium (ONE-A-DAY WOMENS FORMULA PO) Take 1 tablet by mouth daily.     No current facility-administered medications for this visit.    Family History  Problem Relation Age of Onset  . Osteoporosis Mother   . Heart attack Father   . Celiac disease Sister   . Cancer Maternal Grandfather        unknown, heavy smoker  . Heart disease Paternal Grandfather     Review of Systems  All other systems reviewed and are negative.   Exam:   BP 118/64   Pulse 68   Resp 12   Ht 5\' 5"  (1.651  m)   Wt 164 lb (74.4 kg)   LMP 07/13/2000   BMI 27.29 kg/m     General appearance: alert, cooperative and appears stated age Head: normocephalic, without obvious abnormality, atraumatic Neck: no adenopathy, supple, symmetrical, trachea midline and thyroid normal to inspection and palpation Lungs: clear to auscultation bilaterally Breasts: normal appearance, no masses or tenderness, No nipple retraction or dimpling, No nipple discharge or bleeding, No axillary adenopathy Heart: regular rate and rhythm Abdomen: soft, non-tender; no masses, no organomegaly Extremities: extremities normal, atraumatic, no cyanosis or edema Skin: skin color, texture, turgor normal. No rashes or lesions Lymph nodes: cervical, supraclavicular, and axillary nodes normal. Neurologic: grossly normal  Pelvic: External genitalia:  no lesions              No abnormal inguinal nodes palpated.              Urethra:  normal appearing urethra with no masses, tenderness or lesions              Bartholins and Skenes: normal                 Vagina:  Red mucosa. Orange discharge.               Cervix: no lesions  Pap taken: No. Bimanual Exam:  Uterus:  normal size, contour, position, consistency, mobility, non-tender              Adnexa: no mass, fullness, tenderness              Rectal exam: Yes.  .  Confirms.              Anus:  normal sphincter tone, no lesions  Chaperone was present for exam.  Assessment:   Well woman visit with normal exam. Vaginal atrophy.   Plan: Mammogram screening discussed. Self breast awareness reviewed. Pap and HR HPV as above. Guidelines for Calcium, Vitamin D, regular exercise program including cardiovascular and weight bearing exercise. We discussed Vagifem, Estrace cream and Estring.  Risks and benefits of treatment reviewed.  She understands potential effect on breast cancer.  Rx for Estrace cream printed for patient.   Follow up annually and prn.   After visit  summary provided.

## 2020-06-30 NOTE — Patient Instructions (Signed)
EXERCISE AND DIET:  We recommended that you start or continue a regular exercise program for good health. Regular exercise means any activity that makes your heart beat faster and makes you sweat.  We recommend exercising at least 30 minutes per day at least 3 days a week, preferably 4 or 5.  We also recommend a diet low in fat and sugar.  Inactivity, poor dietary choices and obesity can cause diabetes, heart attack, stroke, and kidney damage, among others.    ALCOHOL AND SMOKING:  Women should limit their alcohol intake to no more than 7 drinks/beers/glasses of wine (combined, not each!) per week. Moderation of alcohol intake to this level decreases your risk of breast cancer and liver damage. And of course, no recreational drugs are part of a healthy lifestyle.  And absolutely no smoking or even second hand smoke. Most people know smoking can cause heart and lung diseases, but did you know it also contributes to weakening of your bones? Aging of your skin?  Yellowing of your teeth and nails?  CALCIUM AND VITAMIN D:  Adequate intake of calcium and Vitamin D are recommended.  The recommendations for exact amounts of these supplements seem to change often, but generally speaking 600 mg of calcium (either carbonate or citrate) and 800 units of Vitamin D per day seems prudent. Certain women may benefit from higher intake of Vitamin D.  If you are among these women, your doctor will have told you during your visit.    PAP SMEARS:  Pap smears, to check for cervical cancer or precancers,  have traditionally been done yearly, although recent scientific advances have shown that most women can have pap smears less often.  However, every woman still should have a physical exam from her gynecologist every year. It will include a breast check, inspection of the vulva and vagina to check for abnormal growths or skin changes, a visual exam of the cervix, and then an exam to evaluate the size and shape of the uterus and  ovaries.  And after 72 years of age, a rectal exam is indicated to check for rectal cancers. We will also provide age appropriate advice regarding health maintenance, like when you should have certain vaccines, screening for sexually transmitted diseases, bone density testing, colonoscopy, mammograms, etc.   MAMMOGRAMS:  All women over 40 years old should have a yearly mammogram. Many facilities now offer a "3D" mammogram, which may cost around $50 extra out of pocket. If possible,  we recommend you accept the option to have the 3D mammogram performed.  It both reduces the number of women who will be called back for extra views which then turn out to be normal, and it is better than the routine mammogram at detecting truly abnormal areas.    COLONOSCOPY:  Colonoscopy to screen for colon cancer is recommended for all women at age 50.  We know, you hate the idea of the prep.  We agree, BUT, having colon cancer and not knowing it is worse!!  Colon cancer so often starts as a polyp that can be seen and removed at colonscopy, which can quite literally save your life!  And if your first colonoscopy is normal and you have no family history of colon cancer, most women don't have to have it again for 10 years.  Once every ten years, you can do something that may end up saving your life, right?  We will be happy to help you get it scheduled when you are ready.    Be sure to check your insurance coverage so you understand how much it will cost.  It may be covered as a preventative service at no cost, but you should check your particular policy.      Atrophic Vaginitis  Atrophic vaginitis is a condition in which the tissues that line the vagina become dry and thin. This condition is most common in women who have stopped having regular menstrual periods (are in menopause). This usually starts when a woman is 45-55 years old. That is the time when a woman's estrogen levels begin to drop (decrease). Estrogen is a female  hormone. It helps to keep the tissues of the vagina moist. It stimulates the vagina to produce a clear fluid that lubricates the vagina for sexual intercourse. This fluid also protects the vagina from infection. Lack of estrogen can cause the lining of the vagina to get thinner and dryer. The vagina may also shrink in size. It may become less elastic. Atrophic vaginitis tends to get worse over time as a woman's estrogen level drops. What are the causes? This condition is caused by the normal drop in estrogen that happens around the time of menopause. What increases the risk? Certain conditions or situations may lower a woman's estrogen level, leading to a higher risk for atrophic vaginitis. You are more likely to develop this condition if:  You are taking medicines that block estrogen.  You have had your ovaries removed.  You are being treated for cancer with X-ray (radiation) or medicines (chemotherapy).  You have given birth or are breastfeeding.  You are older than age 50.  You smoke. What are the signs or symptoms? Symptoms of this condition include:  Pain, soreness, or bleeding during sexual intercourse (dyspareunia).  Vaginal burning, irritation, or itching.  Pain or bleeding when a speculum is used in a vaginal exam (pelvic exam).  Having burning pain when passing urine.  Vaginal discharge that is brown or yellow. In some cases, there are no symptoms. How is this diagnosed? This condition is diagnosed by taking a medical history and doing a physical exam. This will include a pelvic exam that checks the vaginal tissues. Though rare, you may also have other tests, including:  A urine test.  A test that checks the acid balance in your vagina (acid balance test). How is this treated? Treatment for this condition depends on how severe your symptoms are. Treatment may include:  Using an over-the-counter vaginal lubricant before sex.  Using a long-acting vaginal  moisturizer.  Using low-dose vaginal estrogen for moderate to severe symptoms that do not respond to other treatments. Options include creams, tablets, and inserts (vaginal rings). Before you use a vaginal estrogen, tell your health care provider if you have a history of: ? Breast cancer. ? Endometrial cancer. ? Blood clots. If you are not sexually active and your symptoms are very mild, you may not need treatment. Follow these instructions at home: Medicines  Take over-the-counter and prescription medicines only as told by your health care provider. Do not use herbal or alternative medicines unless your health care provider says that you can.  Use over-the-counter creams, lubricants, or moisturizers for dryness only as directed by your health care provider. General instructions  If your atrophic vaginitis is caused by menopause, discuss all of your menopause symptoms and treatment options with your health care provider.  Do not douche.  Do not use products that can make your vagina dry. These include: ? Scented feminine sprays. ? Scented tampons. ?   Scented soaps.  Vaginal intercourse can help to improve blood flow and elasticity of vaginal tissue. If it hurts to have sex, try using a lubricant or moisturizer just before having intercourse. Contact a health care provider if:  Your discharge looks different than normal.  Your vagina has an unusual smell.  You have new symptoms.  Your symptoms do not improve with treatment.  Your symptoms get worse. Summary  Atrophic vaginitis is a condition in which the tissues that line the vagina become dry and thin. It is most common in women who have stopped having regular menstrual periods (are in menopause).  Treatment options include using vaginal lubricants and low-dose vaginal estrogen.  Contact a health care provider if your vagina has an unusual smell, or if your symptoms get worse or do not improve after treatment. This  information is not intended to replace advice given to you by your health care provider. Make sure you discuss any questions you have with your health care provider. Document Revised: 10/11/2017 Document Reviewed: 07/25/2017 Elsevier Patient Education  2020 [employer].  

## 2020-10-19 ENCOUNTER — Encounter: Payer: Self-pay | Admitting: Obstetrics and Gynecology

## 2020-10-19 ENCOUNTER — Telehealth: Payer: Self-pay

## 2020-10-19 NOTE — Telephone Encounter (Signed)
Patient left message for nurse to call her regarding scheduling a mammogram.

## 2020-10-19 NOTE — Telephone Encounter (Signed)
Patient is calling to see if the nurse can send an order to [facility] for 3D mammogram. She has an appointment this morning at 9:45am. Patient ask if the nurse can call her back to let her know this has been done.

## 2020-10-19 NOTE — Telephone Encounter (Signed)
Left detailed message for pt on faxed order to [facility] for 3D MMG this morning. Pt to return call if any questions or concerns.  Encounter closed  Faxed order to [facility] placed in scan box.

## 2021-06-30 ENCOUNTER — Telehealth: Payer: Self-pay | Admitting: *Deleted

## 2021-06-30 NOTE — Telephone Encounter (Signed)
Patient called requesting annual labs prior to annual exam week on 07/04/21. I left message for patient to call per notes her PCP done lab past.

## 2021-06-30 NOTE — Telephone Encounter (Signed)
Patient called back in voicemail, I left detailed message on her voicemail that per note PCP does labs due to Medicare. I asked her to call me if questions.

## 2021-07-04 ENCOUNTER — Encounter: Payer: Self-pay | Admitting: Obstetrics and Gynecology

## 2021-07-04 ENCOUNTER — Ambulatory Visit (INDEPENDENT_AMBULATORY_CARE_PROVIDER_SITE_OTHER): Payer: Medicare PPO | Admitting: Obstetrics and Gynecology

## 2021-07-04 ENCOUNTER — Other Ambulatory Visit (HOSPITAL_COMMUNITY)
Admission: RE | Admit: 2021-07-04 | Discharge: 2021-07-04 | Disposition: A | Discharge status: Home / Self Care | Payer: Medicare PPO | Source: Ambulatory Visit | Attending: Obstetrics and Gynecology | Admitting: Obstetrics and Gynecology

## 2021-07-04 ENCOUNTER — Other Ambulatory Visit: Payer: Self-pay

## 2021-07-04 VITALS — BP 140/64 | HR 82 | Ht 64.5 in | Wt 149.0 lb

## 2021-07-04 DIAGNOSIS — Z124 Encounter for screening for malignant neoplasm of cervix: Secondary | ICD-10-CM | POA: Diagnosis present

## 2021-07-04 DIAGNOSIS — Z5181 Encounter for therapeutic drug level monitoring: Secondary | ICD-10-CM

## 2021-07-04 DIAGNOSIS — Z01411 Encounter for gynecological examination (general) (routine) with abnormal findings: Secondary | ICD-10-CM

## 2021-07-04 DIAGNOSIS — N952 Postmenopausal atrophic vaginitis: Secondary | ICD-10-CM

## 2021-07-04 DIAGNOSIS — Z1239 Encounter for other screening for malignant neoplasm of breast: Secondary | ICD-10-CM | POA: Diagnosis not present

## 2021-07-04 DIAGNOSIS — Z008 Encounter for other general examination: Secondary | ICD-10-CM

## 2021-07-04 MED ORDER — ESTRADIOL 0.1 MG/GM VA CREA: TOPICAL_CREAM | VAGINAL | 1 refills | Status: DC

## 2021-07-04 NOTE — Progress Notes (Signed)
75 y.o. G0P0000 Single Caucasian female here for Breast and pelvic exam.    Patient is followed for vaginal atrophy.  Using vaginal estrogen cream.   Enjoying dance lessons with a group from [place].   Swims 5 - 6 days per week. Feels pulling in the right and left groin with the backstroke, not every time.  No other positions cause this.   No vaginal bleeding.  No bladder or bowel concerns.   PCP:  Benedetto Goad, MD  Patient's last menstrual period was 07/13/2000.           Sexually active: Yes.    The current method of family planning is post menopausal status.    Exercising: Yes.     Swimmiing, walking and dancing Smoker:  no  Health Maintenance: Pap:   06-30-19 Neg, 03-06-17 Neg, 03-03-15 Neg History of abnormal Pap:  no MMG:  10-19-20 3D/Neg/Birads1 Colonoscopy:  03-21-15 normal;next 10 years BMD: 03-07-17  Result :Normal TDaP: 03-06-17 Gardasil:   no JXB:[id number] blood in 1980's Hep C: 03-05-16 Neg Screening Labs:  PCP.   reports that she has never smoked. She has never used smokeless tobacco. She reports that she does not drink alcohol and does not use drugs.  Past Medical History:  Diagnosis Date   Anemia     Past Surgical History:  Procedure Laterality Date   APPENDECTOMY  age 64   TONSILLECTOMY AND ADENOIDECTOMY  age 57    Current Outpatient Medications  Medication Sig Dispense Refill   Calcium Citrate-Vitamin D (CITRACAL MAXIMUM PO) Take 1 tablet by mouth daily.     estradiol (ESTRACE) 0.1 MG/GM vaginal cream Use 1/2 g vaginally every night for the first 2 weeks, then use 1/2 g vaginally two or three times per week as needed to maintain symptom relief. 42.5 g 1   Multiple Vitamins-Calcium (ONE-A-DAY WOMENS FORMULA PO) Take 1 tablet by mouth daily.     No current facility-administered medications for this visit.    Family History  Problem Relation Age of Onset   Osteoporosis Mother    Heart attack Father    Celiac disease Sister    Cancer Maternal  Grandfather        unknown, heavy smoker   Heart disease Paternal Grandfather     Review of Systems  All other systems reviewed and are negative.  Exam:   BP 140/64   Pulse 82   Ht 5' 4.5" (1.638 m)   Wt 149 lb (67.6 kg)   LMP 07/13/2000   SpO2 97%   BMI 25.18 kg/m     General appearance: alert, cooperative and appears stated age Head: normocephalic, without obvious abnormality, atraumatic Neck: no adenopathy, supple, symmetrical, trachea midline and thyroid normal to inspection and palpation Lungs: clear to auscultation bilaterally Breasts: normal appearance, no masses or tenderness, No nipple retraction or dimpling, No nipple discharge or bleeding, No axillary adenopathy Heart: regular rate and rhythm Abdomen: soft, non-tender; no masses, no organomegaly Extremities: extremities normal, atraumatic, no cyanosis or edema Skin: skin color, texture, turgor normal. No rashes or lesions Lymph nodes: cervical, supraclavicular, and axillary nodes normal. Neurologic: grossly normal  Pelvic: External genitalia:  no lesions              No abnormal inguinal nodes palpated.              Urethra:  normal appearing urethra with no masses, tenderness or lesions              Bartholins and  Skenes: normal                 Vagina: normal appearing vagina with normal color and discharge, no lesions              Cervix: no lesions              Pap taken: yes Bimanual Exam:  Uterus:  normal size, contour, position, consistency, mobility, non-tender              Adnexa: no mass, fullness, tenderness              Rectal exam: yes.  Confirms.              Anus:  normal sphincter tone, no lesions  Chaperone was present for exam:  Marchelle Folks, CMA.  Assessment:   Screening breast exam.  Pelvic exam with abnormal finding present.  Vaginal atrophy.  Medication monitoring.  Cervical cancer screening.  Rectal exam.   Plan: Mammogram screening discussed. Self breast awareness reviewed. Pap and HR  HPV as above. Refill of Vaginal estrogen cream.   Use 1/2 gram pv at hs two - three times weekly.   Ok to skip using the applicator.  I discussed potential effect on breast cancer.  Labs with PCP.  Follow up annually and prn.   After visit summary provided.   20 min  total time was spent for this patient encounter, including preparation, face-to-face counseling with the patient, coordination of care, and documentation of the encounter.

## 2021-07-04 NOTE — Patient Instructions (Signed)

## 2021-07-05 LAB — CYTOLOGY - PAP: Diagnosis: NEGATIVE

## 2021-10-23 ENCOUNTER — Encounter: Payer: Self-pay | Admitting: Obstetrics and Gynecology

## 2022-01-01 ENCOUNTER — Other Ambulatory Visit: Payer: Self-pay | Admitting: *Deleted

## 2022-01-01 MED ORDER — ESTRADIOL 0.1 MG/GM VA CREA: TOPICAL_CREAM | VAGINAL | 0 refills | Status: DC

## 2022-01-01 NOTE — Telephone Encounter (Signed)
Detailed message on patient voicemail.

## 2022-01-01 NOTE — Telephone Encounter (Signed)
Patient called left message requesting estradiol vaginal cream to pharmacy.  She also left a message with another question, however this question was not clear I asked patient to return my call.

## 2022-01-01 NOTE — Telephone Encounter (Signed)
Patient returned my call. Patient needs refill on estradiol vaginal cream. She also asked if sitting in hot tub with temperature set at 102-104 would be "safe" for her age?  Patient does swim but the pool temperature is much cooler.  Please advise

## 2022-01-01 NOTE — Telephone Encounter (Signed)
I refilled her vaginal estrogen cream.   Please have patient check with her PCP regarding use of a hot tub.

## 2022-07-03 NOTE — Progress Notes (Signed)
76 y.o. G0P0000 Single Caucasian female here for annual breast and pelvic exam.    Patient is followed for vaginal atrophy.  She is using her vaginal estrogen cream.  Drinks a lot of liquids, so she voids often.  Drinks water and orange juice.  Usually has a bottle of water in hand.  Up twice a night to void.  No urinary incontinence.  No pain with urination.  No urgency.  She is taking Primidone for tremor and voice waiver. Patient is asking about what to expect from the future about this.  PCP: Benedetto Goad, MD    Patient's last menstrual period was 07/13/2000.           Sexually active: No.  The current method of family planning is post menopausal status.    Exercising: Yes.     Swimming 5x/week, ballroom dancing, walking Smoker:  no  Health Maintenance: Pap:   07-04-21 Neg, 06-30-19 Neg, 03-06-17 Neg, 03-03-15 Neg History of abnormal Pap:  no MMG:  10-20-21 Neg/BiRads1 Colonoscopy:   03-21-15 normal;next 10 years BMD: 03-07-17  Result :Normal TDaP:  02-24-17 Gardasil:   no HIV: Donated blood in 1980's Hep C: 03-05-16 Neg Screening Labs:  PCP   reports that she has never smoked. She has never used smokeless tobacco. She reports that she does not drink alcohol and does not use drugs.  Past Medical History:  Diagnosis Date   Anemia    Tremors of nervous system     Past Surgical History:  Procedure Laterality Date   APPENDECTOMY  age 77   TONSILLECTOMY AND ADENOIDECTOMY  age 39    Current Outpatient Medications  Medication Sig Dispense Refill   Calcium Citrate-Vitamin D (CITRACAL MAXIMUM PO) Take 1 tablet by mouth daily.     estradiol (ESTRACE) 0.1 MG/GM vaginal cream use 1/2 g vaginally two or three times per week. 42.5 g 0   Multiple Vitamins-Calcium (ONE-A-DAY WOMENS FORMULA PO) Take 1 tablet by mouth daily.     primidone (MYSOLINE) 50 MG tablet TAKE 1/2 TABLET FOR A WEEK, THEN 1 TABLET FOR A WEEK THEN 2 TABS AT BEDTIME FOR TREMOR     triamcinolone cream (KENALOG)  0.1 % Apply topically 2 (two) times daily.     No current facility-administered medications for this visit.    Family History  Problem Relation Age of Onset   Osteoporosis Mother    Heart attack Father    Celiac disease Sister    Cancer Maternal Grandfather        unknown, heavy smoker   Heart disease Paternal Grandfather     Review of Systems  All other systems reviewed and are negative.   Exam:   BP (!) 112/58   Pulse 64   Ht 5' 4.5" (1.638 m)   Wt 149 lb (67.6 kg)   LMP 07/13/2000   SpO2 96%   BMI 25.18 kg/m     General appearance: alert, cooperative and appears stated age Head: normocephalic, without obvious abnormality, atraumatic Neck: no adenopathy, supple, symmetrical, trachea midline and thyroid normal to inspection and palpation Lungs: clear to auscultation bilaterally Breasts: normal appearance, no masses or tenderness, No nipple retraction or dimpling, No nipple discharge or bleeding, No axillary adenopathy Heart: regular rate and rhythm Abdomen: soft, non-tender; no masses, no organomegaly Extremities: extremities normal, atraumatic, no cyanosis or edema Skin: skin color, texture, turgor normal. No rashes or lesions Lymph nodes: cervical, supraclavicular, and axillary nodes normal. Neurologic: grossly normal  Pelvic: External genitalia:  no  lesions              No abnormal inguinal nodes palpated.              Urethra:  normal appearing urethra with no masses, tenderness or lesions              Bartholins and Skenes: normal                 Vagina: erythema of the vagina and orange discharge.  No lesions.               Cervix: no lesions              Pap taken: no Bimanual Exam:  Uterus:  normal size, contour, position, consistency, mobility, non-tender              Adnexa: no mass, fullness, tenderness              Rectal exam: yes.  Confirms.              Anus:  normal sphincter tone, no lesions  Chaperone was present for exam:  Marchelle Folks,  CMA  Assessment:   Well woman visit with gynecologic exam. Vaginal atrophy.  Encounter for medication monitoring.  Dystonia.  Plan: Mammogram screening discussed. Self breast awareness reviewed. Pap and HR HPV 2024. Guidelines for Calcium, Vitamin D, regular exercise program including cardiovascular and weight bearing exercise. Increase use of vaginal estrogen to 1/2 gram 2 - 3 times per week.  Information from Up to Date on dystonia printed for patient.  Follow up annually and prn.   34 min  total time was spent for this patient encounter, including preparation, face-to-face counseling with the patient, coordination of care, and documentation of the encounter.  After visit summary provided.

## 2022-07-05 ENCOUNTER — Ambulatory Visit (INDEPENDENT_AMBULATORY_CARE_PROVIDER_SITE_OTHER): Payer: Medicare PPO | Admitting: Obstetrics and Gynecology

## 2022-07-05 ENCOUNTER — Encounter: Payer: Self-pay | Admitting: Obstetrics and Gynecology

## 2022-07-05 VITALS — BP 112/58 | HR 64 | Ht 64.5 in | Wt 149.0 lb

## 2022-07-05 DIAGNOSIS — Z01419 Encounter for gynecological examination (general) (routine) without abnormal findings: Secondary | ICD-10-CM

## 2022-07-05 DIAGNOSIS — Z5181 Encounter for therapeutic drug level monitoring: Secondary | ICD-10-CM

## 2022-07-05 DIAGNOSIS — G249 Dystonia, unspecified: Secondary | ICD-10-CM

## 2022-07-05 DIAGNOSIS — N952 Postmenopausal atrophic vaginitis: Secondary | ICD-10-CM | POA: Diagnosis not present

## 2022-07-05 MED ORDER — ESTRADIOL 0.1 MG/GM VA CREA: TOPICAL_CREAM | VAGINAL | 1 refills | Status: DC

## 2022-07-05 NOTE — Patient Instructions (Signed)

## 2022-09-19 LAB — COLOGUARD

## 2022-10-05 LAB — COLOGUARD: COLOGUARD: NEGATIVE

## 2022-10-25 ENCOUNTER — Encounter: Payer: Self-pay | Admitting: Obstetrics and Gynecology

## 2023-06-25 ENCOUNTER — Encounter: Payer: Self-pay | Admitting: Neurology

## 2023-06-26 NOTE — Progress Notes (Signed)
77 y.o. G0P0000 Single Caucasian female here for 1 yr med check.    She is followed for vaginal atrophy and use of vaginal estradiol cream, using every other night.  Drinking a lot of water and "light" citrus juice.  Voiding a lot.  No dysuria.   Asking if she can drink too much water.   Labs at [facility] 06/18/23: Normal sodium, BUN, Cr with CMP. Normal urinalysis other than ketones present.   Enjoying ballroom dancing!  PCP:   Benedetto Goad, MD  Patient's last menstrual period was 07/13/2000.           Sexually active: No.  The current method of family planning is post menopausal status.    Exercising: Yes.     swimming  and ballroom dancing. Smoker:  no  Health Maintenance: Pap:  07/04/21 neg, 06/30/19 neg History of abnormal Pap:  no MMG:  10/23/22 Breast Density Cat C, BI-RADS CAT 1 neg Colonoscopy:  03/21/15 BMD:   03/07/17  Result  WNL TDaP:  03/06/17 Gardasil:   no HIV: donated blood in 1980's Hep C: 03/05/16 Screening Labs:  PCP   reports that she has never smoked. She has never used smokeless tobacco. She reports that she does not drink alcohol and does not use drugs.  Past Medical History:  Diagnosis Date   Anemia    Tremors of nervous system     Past Surgical History:  Procedure Laterality Date   APPENDECTOMY  age 57   TONSILLECTOMY AND ADENOIDECTOMY  age 31    Current Outpatient Medications  Medication Sig Dispense Refill   Calcium Citrate-Vitamin D (CITRACAL MAXIMUM PO) Take 1 tablet by mouth daily.     Multiple Vitamins-Calcium (ONE-A-DAY WOMENS FORMULA PO) Take 1 tablet by mouth daily.     primidone (MYSOLINE) 50 MG tablet TAKE 1/2 TABLET FOR A WEEK, THEN 1 TABLET FOR A WEEK THEN 2 TABS AT BEDTIME FOR TREMOR     estradiol (ESTRACE) 0.1 MG/GM vaginal cream use 1/2 g vaginally two or three times per week. 42.5 g 1   No current facility-administered medications for this visit.    Family History  Problem Relation Age of Onset   Osteoporosis Mother     Heart attack Father    Celiac disease Sister    Cancer Maternal Grandfather        unknown, heavy smoker   Heart disease Paternal Grandfather     Review of Systems  All other systems reviewed and are negative.   Exam:   BP 124/78 (BP Location: Right Arm, Patient Position: Sitting, Cuff Size: Normal)   Pulse 68   Ht 5\' 5"  (1.651 m)   Wt 148 lb (67.1 kg)   LMP 07/13/2000   SpO2 96%   BMI 24.63 kg/m     General appearance: alert, cooperative and appears stated age Head: normocephalic, without obvious abnormality, atraumatic Neck: no adenopathy, supple, symmetrical, trachea midline and thyroid normal to inspection and palpation Lungs: clear to auscultation bilaterally Breasts: normal appearance, no masses or tenderness, No nipple retraction or dimpling, No nipple discharge or bleeding, No axillary adenopathy Heart: regular rate and rhythm Abdomen: soft, non-tender; no masses, no organomegaly Extremities: extremities normal, atraumatic, no cyanosis or edema Skin: skin color, texture, turgor normal. No rashes or lesions Lymph nodes: cervical, supraclavicular, and axillary nodes normal. Neurologic: grossly normal  Pelvic: External genitalia:  no lesions              No abnormal inguinal nodes palpated.  Urethra:  normal appearing urethra with no masses, tenderness or lesions              Bartholins and Skenes: normal                 Vagina: erythema of vaginal vault.               Cervix: no lesions              Pap taken: no Bimanual Exam:  Uterus:  normal size, contour, position, consistency, mobility, non-tender              Adnexa: no mass, fullness, tenderness    Chaperone was present for exam:  Warren Lacy, CMA  Assessment:   Vaginal atrophy.  Encounter for medication monitoring.  Health education encounter.   Plan: Mammogram screening discussed. Self breast awareness reviewed. Refill of vaginal estrogen cream. 1/2 gm pv every other night.  Be sure to apply  to the top of the vagina. Pap and HR HPV  2025.  We discussed bladder irritants.  Follow up annually and prn.   25 min  total time was spent for this patient encounter, including preparation, face-to-face counseling with the patient, coordination of care, and documentation of the encounter.

## 2023-07-10 ENCOUNTER — Encounter: Payer: Self-pay | Admitting: Obstetrics and Gynecology

## 2023-07-10 ENCOUNTER — Ambulatory Visit: Payer: Medicare PPO | Admitting: Obstetrics and Gynecology

## 2023-07-10 VITALS — BP 124/78 | HR 68 | Ht 65.0 in | Wt 148.0 lb

## 2023-07-10 DIAGNOSIS — Z719 Counseling, unspecified: Secondary | ICD-10-CM | POA: Diagnosis not present

## 2023-07-10 DIAGNOSIS — N952 Postmenopausal atrophic vaginitis: Secondary | ICD-10-CM | POA: Diagnosis not present

## 2023-07-10 DIAGNOSIS — Z5181 Encounter for therapeutic drug level monitoring: Secondary | ICD-10-CM | POA: Diagnosis not present

## 2023-07-10 MED ORDER — ESTRADIOL 0.1 MG/GM VA CREA: TOPICAL_CREAM | VAGINAL | 1 refills | Status: DC

## 2023-07-18 ENCOUNTER — Ambulatory Visit: Payer: Medicare PPO | Admitting: Neurology

## 2023-07-18 NOTE — Progress Notes (Signed)
Assessment/Plan:   1.  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  Discussed treatment, particularly Botox..  Discussed risks and benefits.  Discussed that if she would want to do this in conjunction with #2, they would have to be done within a few days of 1 another and we could arrange that through [facility].  If she would just want to do Botox for dystonic tremor, then we could get her a referral to Dr. Wynn Banker office.  Right now, she decided to hold off on that and I do not disagree.  -She asked me about other things that she can do proactively to help, but I do not really think that any other therapies are going to help particularly.  -Reassured her that I saw no evidence of Parkinsons disease.  2.  Spasmodic dysphonia  -Again discussed nature and pathophysiology.  Discussed that Botox is the treatment of choice, but that no one in the [place]/ area does this, but there are 2 physicians within ENT at [facility] that do Botox for spasmodic dysphonia.  Discussed that this can cause hypophonic speech.  She does not want a referral right now.  She can let me know if she changes her mind.  Subjective:   Kayla Wang was seen in consultation in the movement disorder clinic at the request of Barbie Banner, MD.  The evaluation is for tremor.  Tremor started at least 1 year ago and involves the voice and hands.  She thinks that it started in the fingers first, and she notes it mostly on the R as she is R handed.  Tremor is most noticeable when writing.   There is a family hx of tremor in her dad but it was only in his fingers.  No tremor in her siblings  Affected by caffeine:  doesn't drink any Affected by alcohol:  doesn't drink any Affected by stress:  unsure - seemed to start in voice when speaking in front of group but now its at random times Affected by fatigue:  Yes.   Spills soup if on spoon:  no Spills glass of liquid if full:   No.  Current/Previously tried tremor medications: on primidone 50 mg, 1 po q hs (records indicate 2 po q hs but she reports only 1 po q hs - has been on this dose for about 2-1/2 years per records) - she "hopes" its been effective but not sure  Current medications that may exacerbate tremor:  n/a  Outside reports reviewed: historical medical records, office notes, and referral letter/letters.  Allergies  Allergen Reactions   Gluten Meal Other (See Comments)   Penicillins     Current Meds  Medication Sig   Calcium Citrate-Vitamin D (CITRACAL MAXIMUM PO) Take 1 tablet by mouth daily.   estradiol (ESTRACE) 0.1 MG/GM vaginal cream use 1/2 g vaginally two or three times per week.   Multiple Vitamins-Calcium (ONE-A-DAY WOMENS FORMULA PO) Take 1 tablet by mouth daily.   primidone (MYSOLINE) 50 MG tablet TAKE 1/2 TABLET FOR A WEEK, THEN 1 TABLET FOR A WEEK THEN 2 TABS AT BEDTIME FOR TREMOR   Objective:   VITALS:   Vitals:   07/22/23 0917  BP: 122/82  Pulse: 60  SpO2: 99%  Weight: 150 lb (68 kg)  Height: 5\' 5"  (1.651 m)   Gen:  Appears stated age and in NAD. HEENT:  Normocephalic, atraumatic. The mucous membranes are moist. The superficial temporal arteries are without ropiness or tenderness. Cardiovascular: Regular  rate and rhythm. Lungs: Clear to auscultation bilaterally. Neck: There are no carotid bruits noted bilaterally.  NEUROLOGICAL:  Orientation:  The patient is alert and oriented x 3.   Cranial nerves: There is good facial symmetry. Extraocular muscles are intact and visual fields are full to confrontational testing. Speech is fluent with mild to moderate spasmodic dysphonia. Soft palate rises symmetrically and there is no tongue deviation. Hearing is intact to conversational tone. Tone: Tone is good throughout. Sensation: Sensation is intact to light touch touch throughout (facial, trunk, extremities). Vibration is intact at the bilateral big toe but it is slightly  decreased. There is no extinction with double simultaneous stimulation. There is no sensory dermatomal level identified. Coordination:  The patient has no dysdiadichokinesia or dysmetria. Motor: Strength is 5/5 in the bilateral upper and lower extremities.  Shoulder shrug is equal bilaterally.  There is no pronator drift.  There are no fasciculations noted. DTR's: Deep tendon reflexes are 1/4 at the bilateral biceps, triceps, brachioradialis, patella and achilles.  Plantar responses are downgoing bilaterally. Gait and Station: The patient is able to ambulate without difficulty.   MOVEMENT EXAM: Tremor:  There is no rest tremor.  There is no postural tremor.  No intention tremor.  No asterixis.  She has trouble with Archimedes spirals on the right.  She does not have as much trouble on the left.  She has significant troubles writing a sentence, with evidence of flexion of the wrist while she writes. Handwriting sample:   Archimedes spirals:      I have reviewed and interpreted the following labs independently Patient had lab work with primary care on June 18, 2023.  Sodium was 139, potassium 4.5, chloride 106, CO2 26, BUN 14, creatinine 0.86.  AST is 19, ALT is 16.  TSH was 1.260 on June 18, 2023.  White blood cells were low at 3.5, hemoglobin 13.7, hematocrit 40.8 and platelets were 184.   Total time spent on today's visit was 60 minutes, including both face-to-face time and nonface-to-face time.  Time included that spent on review of records (prior notes available to me/labs/imaging if pertinent), discussing treatment and goals, answering patient's questions and coordinating care.  CC:  Barbie Banner, MD

## 2023-07-22 ENCOUNTER — Encounter: Payer: Self-pay | Admitting: Neurology

## 2023-07-22 ENCOUNTER — Ambulatory Visit: Payer: Medicare PPO | Admitting: Neurology

## 2023-07-22 VITALS — BP 122/82 | HR 60 | Ht 65.0 in | Wt 150.0 lb

## 2023-07-22 DIAGNOSIS — J383 Other diseases of vocal cords: Secondary | ICD-10-CM

## 2023-07-22 DIAGNOSIS — F488 Other specified nonpsychotic mental disorders: Secondary | ICD-10-CM

## 2023-07-22 NOTE — Patient Instructions (Signed)

## 2023-10-25 LAB — HM MAMMOGRAPHY

## 2024-07-14 ENCOUNTER — Encounter: Payer: Medicare PPO | Admitting: Obstetrics and Gynecology

## 2024-07-17 NOTE — Progress Notes (Unsigned)
 78 y.o. G0P0000 Single Caucasian female here for a breast and pelvic exam. Not sleeping well, only sleeping about 4-5 hours a night. Would like to discuss more of the side effects of estradiol .    The patient is also followed for vaginal atrophy. Ran out of vaginal estradiol .   Was using vaginal estradiol  cream 1/2 gram two to three times a week.    Feeling like she is bruising easily.    Drinking an orange drink daily and also drinking a lot of water.  Voiding often.  Voiding at night.    Volunteering at church.   PCP: Lazoff, Shawn P, DO   Patient's last menstrual period was 07/13/2000.           Sexually active: No.  The current method of family planning is post menopausal status.    Menopausal hormone therapy:  Estrace   Exercising: Yes.    Swimming, walking, and dance lessons  Smoker:  no  OB History     Gravida  0   Para  0   Term  0   Preterm  0   AB  0   Living  0      SAB  0   IAB  0   Ectopic  0   Multiple  0   Live Births  0           HEALTH MAINTENANCE: Last 2 paps: 07/04/21 neg, 06/30/19 neg  History of abnormal Pap or positive HPV:  no Mammogram:  10/25/23 - [facility], 10/23/22 Breast Density Cat C, BIRADS Cat 1 neg  Colonoscopy:  03/21/15 -  Bone Density:  10/28/23  Result  unavailable (care everywhere)    Immunization History  Administered Date(s) Administered   Influenza,inj,Quad PF,6+ Mos 10/10/2016, 07/21/2017   Influenza-Unspecified 10/10/2016   Moderna Covid-19 Fall Seasonal Vaccine 81yrs & older 08/29/2022   PFIZER Comirnaty(Gray Top)Covid-19 Tri-Sucrose Vaccine 02/13/2021   PFIZER(Purple Top)SARS-COV-2 Vaccination 12/03/2019, 12/24/2019, 07/01/2020   Pfizer Covid-19 Vaccine Bivalent Booster 45yrs & up 08/10/2021   Pfizer(Comirnaty)Fall Seasonal Vaccine 12 years and older 07/29/2023   Pneumococcal Conjugate-13 02/23/2014, 10/10/2016   Pneumococcal Polysaccharide-23 05/22/2017   Tdap 01/15/2007, 03/06/2017   Zoster  Recombinant(Shingrix) 03/04/2018   Zoster, Live 11/28/2011      reports that she has never smoked. She has never used smokeless tobacco. She reports that she does not drink alcohol and does not use drugs.  Past Medical History:  Diagnosis Date   Anemia    Tremors of nervous system     Past Surgical History:  Procedure Laterality Date   APPENDECTOMY  age 24   TONSILLECTOMY AND ADENOIDECTOMY  age 59    Current Outpatient Medications  Medication Sig Dispense Refill   Calcium Citrate-Vitamin D  (CITRACAL MAXIMUM PO) Take 1 tablet by mouth daily.     estradiol  (ESTRACE ) 0.1 MG/GM vaginal cream use 1/2 g vaginally two or three times per week. 42.5 g 1   Multiple Vitamins-Calcium (ONE-A-DAY WOMENS FORMULA PO) Take 1 tablet by mouth daily.     primidone (MYSOLINE) 50 MG tablet TAKE 1/2 TABLET FOR A WEEK, THEN 1 TABLET FOR A WEEK THEN 2 TABS AT BEDTIME FOR TREMOR     No current facility-administered medications for this visit.    ALLERGIES: Gluten meal and Penicillins  Family History  Problem Relation Age of Onset   Osteoporosis Mother    Heart attack Father    Tremor Father    Celiac disease Sister    Cancer Maternal Grandfather  unknown, heavy smoker   Heart disease Paternal Grandfather     Review of Systems  All other systems reviewed and are negative.   PHYSICAL EXAM:  BP 114/60 (BP Location: Left Arm, Patient Position: Sitting)   Pulse 76   Ht 5' 5.5 (1.664 m)   Wt 145 lb (65.8 kg)   LMP 07/13/2000   SpO2 96%   BMI 23.76 kg/m     General appearance: alert, cooperative and appears stated age Head: normocephalic, without obvious abnormality, atraumatic Neck: no adenopathy, supple, symmetrical, trachea midline and thyroid normal to inspection and palpation Lungs: clear to auscultation bilaterally Breasts: normal appearance, no masses or tenderness, No nipple retraction or dimpling, No nipple discharge or bleeding, No axillary adenopathy Heart: regular rate  and rhythm Abdomen: soft, non-tender; no masses, no organomegaly Extremities: extremities normal, atraumatic, no cyanosis or edema Skin: skin color, texture, turgor normal. No rashes or lesions Lymph nodes: cervical, supraclavicular, and axillary nodes normal. Neurologic: grossly normal  Pelvic: External genitalia:  no lesions              No abnormal inguinal nodes palpated.              Urethra:  normal appearing urethra with no masses, tenderness or lesions              Bartholins and Skenes: normal                 Vagina: normal appearing vagina with normal color and discharge, no lesions.  Atrophy noted.               Cervix: no lesions              Pap taken: yes Bimanual Exam:  Uterus:  normal size, contour, position, consistency, mobility, non-tender              Adnexa: no mass, fullness, tenderness              Rectal exam: yes.  Confirms.              Anus:  normal sphincter tone, no lesions  Chaperone was present for exam:  Kari HERO, CMA  ASSESSMENT: Encounter for breast and pelvic exam.  Cervical cancer screening.  Vaginal atrophy.   Encounter for medication monitoring.   PLAN: Mammogram screening discussed.  Will get copy of mammogram from [facility] 2024.  Self breast awareness reviewed. Pap and HRV collected:  yes Guidelines for Calcium, Vitamin D , regular exercise program including cardiovascular and weight bearing exercise. Medication refills:  Restart vaginal estradiol  cream. Benefits of treating urogenital atrophy and reducing risk of postmenopausal bladder infection discussed.  I reviewed potential effect on breast cancer.  Refill of vaginal estradiol  cream.  Labs with PCP. Follow up:  1 year and prn.     Additional counseling given.  yes. 25 min  total time was spent for this patient encounter, including preparation, face-to-face counseling with the patient, coordination of care, and documentation of the encounter in addition to doing the breast and pelvic exam.

## 2024-07-22 ENCOUNTER — Encounter: Payer: Self-pay | Admitting: Obstetrics and Gynecology

## 2024-07-22 ENCOUNTER — Ambulatory Visit (INDEPENDENT_AMBULATORY_CARE_PROVIDER_SITE_OTHER): Payer: Medicare PPO | Admitting: Obstetrics and Gynecology

## 2024-07-22 ENCOUNTER — Ambulatory Visit: Payer: Self-pay | Admitting: Obstetrics and Gynecology

## 2024-07-22 ENCOUNTER — Other Ambulatory Visit (HOSPITAL_COMMUNITY)
Admission: RE | Admit: 2024-07-22 | Discharge: 2024-07-22 | Disposition: A | Discharge status: Home / Self Care | Source: Ambulatory Visit | Attending: Obstetrics and Gynecology | Admitting: Obstetrics and Gynecology

## 2024-07-22 VITALS — BP 114/60 | HR 76 | Ht 65.5 in | Wt 145.0 lb

## 2024-07-22 DIAGNOSIS — N952 Postmenopausal atrophic vaginitis: Secondary | ICD-10-CM

## 2024-07-22 DIAGNOSIS — Z5181 Encounter for therapeutic drug level monitoring: Secondary | ICD-10-CM

## 2024-07-22 DIAGNOSIS — Z01419 Encounter for gynecological examination (general) (routine) without abnormal findings: Secondary | ICD-10-CM | POA: Insufficient documentation

## 2024-07-22 DIAGNOSIS — Z1151 Encounter for screening for human papillomavirus (HPV): Secondary | ICD-10-CM | POA: Insufficient documentation

## 2024-07-22 DIAGNOSIS — Z124 Encounter for screening for malignant neoplasm of cervix: Secondary | ICD-10-CM

## 2024-07-22 MED ORDER — ESTRADIOL 0.1 MG/GM VA CREA: TOPICAL_CREAM | VAGINAL | 2 refills | Status: AC

## 2024-07-22 NOTE — Patient Instructions (Signed)

## 2024-07-23 LAB — CYTOLOGY - PAP
Comment: NEGATIVE
Diagnosis: NEGATIVE
High risk HPV: NEGATIVE

## 2024-07-28 ENCOUNTER — Ambulatory Visit: Payer: Self-pay | Admitting: Obstetrics and Gynecology

## 2025-07-29 ENCOUNTER — Ambulatory Visit: Admitting: Obstetrics and Gynecology
